# Patient Record
Sex: Female | Born: 1980 | Race: White | Hispanic: No | Marital: Single | Smoking: Current every day smoker
Health system: Southern US, Community
[De-identification: ages and names within clinical notes are randomized; demographics above are authoritative.]

## PROBLEM LIST (undated history)

## (undated) ENCOUNTER — Inpatient Hospital Stay (HOSPITAL_COMMUNITY): Payer: Self-pay

## (undated) DIAGNOSIS — I10 Essential (primary) hypertension: Secondary | ICD-10-CM

## (undated) DIAGNOSIS — M51369 Other intervertebral disc degeneration, lumbar region without mention of lumbar back pain or lower extremity pain: Secondary | ICD-10-CM

## (undated) DIAGNOSIS — M5136 Other intervertebral disc degeneration, lumbar region: Secondary | ICD-10-CM

## (undated) DIAGNOSIS — N3010 Interstitial cystitis (chronic) without hematuria: Secondary | ICD-10-CM

## (undated) DIAGNOSIS — F419 Anxiety disorder, unspecified: Secondary | ICD-10-CM

## (undated) DIAGNOSIS — IMO0002 Reserved for concepts with insufficient information to code with codable children: Secondary | ICD-10-CM

## (undated) DIAGNOSIS — J45909 Unspecified asthma, uncomplicated: Secondary | ICD-10-CM

## (undated) DIAGNOSIS — R87629 Unspecified abnormal cytological findings in specimens from vagina: Secondary | ICD-10-CM

## (undated) DIAGNOSIS — G4700 Insomnia, unspecified: Secondary | ICD-10-CM

## (undated) DIAGNOSIS — R87619 Unspecified abnormal cytological findings in specimens from cervix uteri: Secondary | ICD-10-CM

## (undated) DIAGNOSIS — F329 Major depressive disorder, single episode, unspecified: Secondary | ICD-10-CM

## (undated) DIAGNOSIS — F909 Attention-deficit hyperactivity disorder, unspecified type: Secondary | ICD-10-CM

## (undated) DIAGNOSIS — N3281 Overactive bladder: Secondary | ICD-10-CM

## (undated) DIAGNOSIS — F32A Depression, unspecified: Secondary | ICD-10-CM

## (undated) HISTORY — DX: Other intervertebral disc degeneration, lumbar region: M51.36

## (undated) HISTORY — PX: WISDOM TOOTH EXTRACTION: SHX21

## (undated) HISTORY — DX: Other intervertebral disc degeneration, lumbar region without mention of lumbar back pain or lower extremity pain: M51.369

## (undated) HISTORY — DX: Interstitial cystitis (chronic) without hematuria: N30.10

## (undated) HISTORY — DX: Unspecified abnormal cytological findings in specimens from vagina: R87.629

---

## 2011-10-25 HISTORY — PX: COLPOSCOPY: SHX161

## 2012-08-17 ENCOUNTER — Inpatient Hospital Stay (HOSPITAL_COMMUNITY)
Admission: AD | Admit: 2012-08-17 | Discharge: 2012-08-18 | Disposition: A | Discharge status: Home / Self Care | Payer: Non-veteran care | Source: Ambulatory Visit | Attending: Obstetrics & Gynecology | Admitting: Obstetrics & Gynecology

## 2012-08-17 ENCOUNTER — Encounter (HOSPITAL_COMMUNITY): Payer: Self-pay

## 2012-08-17 DIAGNOSIS — R109 Unspecified abdominal pain: Secondary | ICD-10-CM | POA: Insufficient documentation

## 2012-08-17 DIAGNOSIS — O26899 Other specified pregnancy related conditions, unspecified trimester: Secondary | ICD-10-CM

## 2012-08-17 DIAGNOSIS — O99891 Other specified diseases and conditions complicating pregnancy: Secondary | ICD-10-CM | POA: Insufficient documentation

## 2012-08-17 HISTORY — DX: Overactive bladder: N32.81

## 2012-08-17 HISTORY — DX: Reserved for concepts with insufficient information to code with codable children: IMO0002

## 2012-08-17 HISTORY — DX: Unspecified asthma, uncomplicated: J45.909

## 2012-08-17 HISTORY — DX: Depression, unspecified: F32.A

## 2012-08-17 HISTORY — DX: Major depressive disorder, single episode, unspecified: F32.9

## 2012-08-17 HISTORY — DX: Insomnia, unspecified: G47.00

## 2012-08-17 HISTORY — DX: Attention-deficit hyperactivity disorder, unspecified type: F90.9

## 2012-08-17 HISTORY — DX: Anxiety disorder, unspecified: F41.9

## 2012-08-17 HISTORY — DX: Unspecified abnormal cytological findings in specimens from cervix uteri: R87.619

## 2012-08-17 HISTORY — DX: Essential (primary) hypertension: I10

## 2012-08-17 LAB — URINALYSIS, ROUTINE W REFLEX MICROSCOPIC
Glucose, UA: NEGATIVE mg/dL
Hgb urine dipstick: NEGATIVE
Leukocytes, UA: NEGATIVE
Specific Gravity, Urine: 1.025 (ref 1.005–1.030)
pH: 6 (ref 5.0–8.0)

## 2012-08-17 LAB — CBC
HCT: 39.9 % (ref 36.0–46.0)
Hemoglobin: 13.4 g/dL (ref 12.0–15.0)
MCHC: 33.6 g/dL (ref 30.0–36.0)
RDW: 13.4 % (ref 11.5–15.5)
WBC: 10.6 10*3/uL — ABNORMAL HIGH (ref 4.0–10.5)

## 2012-08-17 LAB — POCT PREGNANCY, URINE
Preg Test, Ur: POSITIVE
Preg Test, Ur: POSITIVE — AB

## 2012-08-17 NOTE — MAU Provider Note (Signed)
Chief Complaint: Abdominal Pain   First Provider Initiated Contact with Patient 08/18/12 0244     SUBJECTIVE HPI: Pamela Duncan is a 31 y.o. G2P0010 at [redacted]w[redacted]d by LMP who presents with constant, dull low abd pain x 3 days, 6/10 on pain scale. Initially R>L, now L>R radiating to L rib. No bleeding, vaginal discharge, urinary complaints, GI complaints. Has taken Tylenol, tried position changes and used warm compresses w/ no relief.   Past Medical History  Diagnosis Date  . Anxiety   . Depression   . ADHD (attention deficit hyperactivity disorder)   . Insomnia   . Asthma   . Hypertension     stopped taking medicine  . Overactive bladder   . Abnormal Pap smear     colpo       OB History    Grav Para Term Preterm Abortions TAB SAB Ect Mult Living   2 0 0 0 1 0 1 0 0 0      # Outc Date GA Lbr Len/2nd Wgt Sex Del Anes PTL Lv   1 SAB            2 CUR              Past Surgical History  Procedure Date  . Colposcopy 2013  . Wisdom tooth extraction    History   Social History  . Marital Status: Single    Spouse Name: N/A    Number of Children: N/A  . Years of Education: N/A   Occupational History  . Not on file.   Social History Main Topics  . Smoking status: Current Every Day Smoker    Types: Cigars  . Smokeless tobacco: Not on file  . Alcohol Use: No  . Drug Use: No  . Sexually Active: Yes   Other Topics Concern  . Not on file   Social History Narrative  . No narrative on file   No current facility-administered medications on file prior to encounter.   Current Outpatient Prescriptions on File Prior to Encounter  Medication Sig Dispense Refill  . methylphenidate (CONCERTA) 18 MG CR tablet Take 18 mg by mouth every morning.      Marland Kitchen PARoxetine (PAXIL) 40 MG tablet Take 40 mg by mouth every morning.      . zolpidem (AMBIEN) 10 MG tablet Take 10 mg by mouth at bedtime as needed.       Allergies  Allergen Reactions  . Codeine Hives  . Latex Rash    ROS:  Pertinent items in HPI  OBJECTIVE Blood pressure 129/70, pulse 92, temperature 98.3 F (36.8 C), temperature source Oral, resp. rate 22, height 5\' 5"  (1.651 m), weight 96.798 kg (213 lb 6.4 oz), last menstrual period 07/13/2012. GENERAL: Well-developed, well-nourished female in mild distress. None when CNM out of room per RN. HEENT: Normocephalic HEART: normal rate RESP: normal effort ABDOMEN: Soft, non-tender EXTREMITIES: Nontender, no edema NEURO: Alert and oriented SPECULUM EXAM: NEFG, physiologic discharge, no blood noted, cervix friable BIMANUAL: cervix closed; uterus normal size, no, CNM or adnexal tenderness or masses  LAB RESULTS Results for orders placed during the hospital encounter of 08/17/12 (from the past 24 hour(s))  URINALYSIS, ROUTINE W REFLEX MICROSCOPIC     Status: Normal   Collection Time   08/17/12  9:19 PM      Component Value Range   Color, Urine YELLOW  YELLOW   APPearance CLEAR  CLEAR   Specific Gravity, Urine 1.025  1.005 - 1.030   pH  6.0  5.0 - 8.0   Glucose, UA NEGATIVE  NEGATIVE mg/dL   Hgb urine dipstick NEGATIVE  NEGATIVE   Bilirubin Urine NEGATIVE  NEGATIVE   Ketones, ur NEGATIVE  NEGATIVE mg/dL   Protein, ur NEGATIVE  NEGATIVE mg/dL   Urobilinogen, UA 0.2  0.0 - 1.0 mg/dL   Nitrite NEGATIVE  NEGATIVE   Leukocytes, UA NEGATIVE  NEGATIVE  POCT PREGNANCY, URINE     Status: Abnormal   Collection Time   08/17/12  9:29 PM      Component Value Range   Preg Test, Ur POSITIVE (*) NEGATIVE  HCG, QUANTITATIVE, PREGNANCY     Status: Abnormal   Collection Time   08/17/12 11:39 PM      Component Value Range   hCG, Beta Chain, Quant, S 1481 (*) <5 mIU/mL  CBC     Status: Abnormal   Collection Time   08/17/12 11:41 PM      Component Value Range   WBC 10.6 (*) 4.0 - 10.5 K/uL   RBC 4.40  3.87 - 5.11 MIL/uL   Hemoglobin 13.4  12.0 - 15.0 g/dL   HCT 16.1  09.6 - 04.5 %   MCV 90.7  78.0 - 100.0 fL   MCH 30.5  26.0 - 34.0 pg   MCHC 33.6  30.0 - 36.0  g/dL   RDW 40.9  81.1 - 91.4 %   Platelets 235  150 - 400 K/uL  ABO/RH     Status: Normal (Preliminary result)   Collection Time   08/17/12 11:42 PM      Component Value Range   ABO/RH(D) O NEG      IMAGING US Ob Comp Less 14 Wks  08/18/2012  *RADIOLOGY REPORT*  Clinical Data: Left lower quadrant pain, pregnant, beta HCG pending  OBSTETRIC <14 WK [place] AND TRANSVAGINAL OB US  Technique:  Both transabdominal and transvaginal ultrasound examinations were performed for complete evaluation of the gestation as well as the maternal uterus, adnexal regions, and pelvic cul-de-sac.  Transvaginal technique was performed to assess early pregnancy.  Comparison:  None.  Intrauterine gestational sac:  Visualized/normal in shape. Yolk sac: Not visualized. Embryo: Not visualized.  MSD: 3.9 mm  4 w 6 d [place] EDC: 04/21/2013  Maternal uterus/adnexae: No subchorionic hemorrhage.  Left ovary is within normal limits, measuring 1.6 x 2.2 x 1.5 cm.  Right ovary is within normal limits, measuring 2.6 x 3.1 by 1.7 cm, and is notable for a 1.3 x 1.4 x 1.3 cm corpus luteal cyst.  No free fluid.  IMPRESSION: Single intrauterine gestation with estimated gestational age [redacted] weeks 6 days by mean sac diameter.  No yolk sac or fetal pole is seen.  Serial beta HCG with a follow-up ultrasound in 14 days is suggested.   Original Report Authenticated By: Charline Bills, M.D.    US Ob Transvaginal  08/18/2012  *RADIOLOGY REPORT*  Clinical Data: Left lower quadrant pain, pregnant, beta HCG pending  OBSTETRIC <14 WK [place] AND TRANSVAGINAL OB US  Technique:  Both transabdominal and transvaginal ultrasound examinations were performed for complete evaluation of the gestation as well as the maternal uterus, adnexal regions, and pelvic cul-de-sac.  Transvaginal technique was performed to assess early pregnancy.  Comparison:  None.  Intrauterine gestational sac:  Visualized/normal in shape. Yolk sac: Not visualized. Embryo: Not visualized.  MSD: 3.9 mm   4 w 6 d [place] EDC: 04/21/2013  Maternal uterus/adnexae: No subchorionic hemorrhage.  Left ovary is within normal limits, measuring 1.6  x 2.2 x 1.5 cm.  Right ovary is within normal limits, measuring 2.6 x 3.1 by 1.7 cm, and is notable for a 1.3 x 1.4 x 1.3 cm corpus luteal cyst.  No free fluid.  IMPRESSION: Single intrauterine gestation with estimated gestational age [redacted] weeks 6 days by mean sac diameter.  No yolk sac or fetal pole is seen.  Serial beta HCG with a follow-up ultrasound in 14 days is suggested.   Original Report Authenticated By: Charline Bills, M.D.     MAU COURSE Requesting IM pain meds. Will give 1 Vicodin and Vistaril instead.   ASSESSMENT 1. Abdominal pain complicating pregnancy, antepartum    PLAN Discharge home SAB and ectopic precautions Follow-up Information    Follow up with [facility] [place] MATERNITY ADMISSIONS. On 08/20/2012. (for blood work)    Solicitor information:   [address] [id number] Wilhemina Bonito [place] [address] [phone]      Follow up with [facility] [place] MATERNITY ADMISSIONS. In 10 days. (ultrasound)    Contact information:   [address] [id number] mc [place] [address] [phone]          Medication List     As of 08/18/2012  2:48 AM    TAKE these medications         acetaminophen 500 MG tablet   Commonly known as: TYLENOL   Take 1,000 mg by mouth every 6 (six) hours as needed.      HYDROcodone-acetaminophen 5-500 MG per tablet   Commonly known as: VICODIN   Take 1 tablet by mouth every 6 (six) hours as needed for pain.      methylphenidate 18 MG CR tablet   Commonly known as: CONCERTA   Take 18 mg by mouth every morning.      PARoxetine 40 MG tablet   Commonly known as: PAXIL   Take 40 mg by mouth every morning.      prenatal multivitamin Tabs   Take 1 tablet by mouth daily.      zolpidem 10 MG tablet   Commonly known as: AMBIEN   Take 10 mg  by mouth at bedtime as needed.          [place], CNM 08/18/2012  1:06 AM

## 2012-08-17 NOTE — MAU Note (Signed)
For past 3 days have dull aching in stomach that is getting worse. More on L and goes from rib to pelvis. No bleeding

## 2012-08-18 ENCOUNTER — Encounter (HOSPITAL_COMMUNITY): Payer: Self-pay

## 2012-08-18 ENCOUNTER — Inpatient Hospital Stay (HOSPITAL_COMMUNITY): Payer: Non-veteran care

## 2012-08-18 DIAGNOSIS — R109 Unspecified abdominal pain: Secondary | ICD-10-CM

## 2012-08-18 DIAGNOSIS — O9989 Other specified diseases and conditions complicating pregnancy, childbirth and the puerperium: Secondary | ICD-10-CM

## 2012-08-18 LAB — ABO/RH: ABO/RH(D): O NEG

## 2012-08-18 LAB — HCG, QUANTITATIVE, PREGNANCY: hCG, Beta Chain, Quant, S: 1481 m[IU]/mL — ABNORMAL HIGH (ref <5)

## 2012-08-18 LAB — WET PREP, GENITAL: Trich, Wet Prep: NONE SEEN

## 2012-08-18 LAB — GC/CHLAMYDIA PROBE AMP, GENITAL: Chlamydia, DNA Probe: NEGATIVE

## 2012-08-18 MED ORDER — HYDROCODONE-ACETAMINOPHEN 5-500 MG PO TABS: 1.0000 | ORAL_TABLET | Freq: Four times a day (QID) | ORAL | Status: DC | PRN

## 2012-08-18 MED ORDER — HYDROXYZINE HCL 50 MG PO TABS
50.0000 mg | ORAL_TABLET | Freq: Once | ORAL | Status: AC
  Administered 2012-08-18: 50 mg via ORAL
  Filled 2012-08-18: qty 1

## 2012-08-18 MED ORDER — HYDROCODONE-ACETAMINOPHEN 5-325 MG PO TABS
1.0000 | ORAL_TABLET | Freq: Once | ORAL | Status: AC
  Administered 2012-08-18: 1 via ORAL
  Filled 2012-08-18: qty 1

## 2012-08-20 ENCOUNTER — Inpatient Hospital Stay (HOSPITAL_COMMUNITY)
Admission: AD | Admit: 2012-08-20 | Discharge: 2012-08-20 | Disposition: A | Discharge status: Home / Self Care | Payer: Non-veteran care | Source: Ambulatory Visit | Attending: Obstetrics & Gynecology | Admitting: Obstetrics & Gynecology

## 2012-08-20 DIAGNOSIS — O99891 Other specified diseases and conditions complicating pregnancy: Secondary | ICD-10-CM | POA: Insufficient documentation

## 2012-08-20 DIAGNOSIS — R109 Unspecified abdominal pain: Secondary | ICD-10-CM | POA: Insufficient documentation

## 2012-08-20 LAB — HCG, QUANTITATIVE, PREGNANCY: hCG, Beta Chain, Quant, S: 3838 m[IU]/mL — ABNORMAL HIGH (ref <5)

## 2012-08-20 NOTE — MAU Provider Note (Signed)
History     CSN: [id number]  Arrival date and time: 08/20/12 0903   None     No chief complaint on file.  HPI Pamela Duncan is 31 y.o. G2P0010 [redacted]w[redacted]d weeks presenting for follow up BHCG.  She was seen 2 days ago with lower abdominal pain in early pregnancy.  On that date BHCG 1481,Blood type O NEg(patient was not bleeding on that visit, rhophylac not administered, Ultrasound showed [redacted]w[redacted]d IUGS with No YS, FP, free fluid.  Normal appearing ovaries.  She continues with intermittent pain that goes from right and left.  Taking tylenol for discomfort.   Denies vaginal bleeding.    Past Medical History  Diagnosis Date  . Anxiety   . Depression   . ADHD (attention deficit hyperactivity disorder)   . Insomnia   . Asthma   . Hypertension     stopped taking medicine  . Overactive bladder   . Abnormal Pap smear     colpo    Past Surgical History  Procedure Date  . Colposcopy 2013  . Wisdom tooth extraction     Family History  Problem Relation Age of Onset  . Other Neg Hx     History  Substance Use Topics  . Smoking status: Current Every Day Smoker    Types: Cigars  . Smokeless tobacco: Not on file  . Alcohol Use: No    Allergies:  Allergies  Allergen Reactions  . Codeine Hives  . Latex Rash    Prescriptions prior to admission  Medication Sig Dispense Refill  . acetaminophen (TYLENOL) 500 MG tablet Take 1,000 mg by mouth every 6 (six) hours as needed.      Marland Kitchen HYDROcodone-acetaminophen (VICODIN) 5-500 MG per tablet Take 1 tablet by mouth every 6 (six) hours as needed for pain.  10 tablet  0  . methylphenidate (CONCERTA) 18 MG CR tablet Take 18 mg by mouth every morning.      Marland Kitchen PARoxetine (PAXIL) 40 MG tablet Take 40 mg by mouth every morning.      . Prenatal Vit-Fe Fumarate-FA (PRENATAL MULTIVITAMIN) TABS Take 1 tablet by mouth daily.      Marland Kitchen zolpidem (AMBIEN) 10 MG tablet Take 10 mg by mouth at bedtime as needed.        Review of Systems  Gastrointestinal: Positive  for abdominal pain (cramping bilaterally).  Genitourinary:       Not indicated  NEg for vaginal bleeding   Physical Exam   Last menstrual period 07/13/2012.  Physical Exam  Constitutional: She is oriented to person, place, and time. She appears well-developed and well-nourished. No distress.  HENT:  Head: Normocephalic.  Neck: Normal range of motion.  Respiratory: Effort normal.  Genitourinary:       Not indicated  Neurological: She is alert and oriented to person, place, and time.  Skin: Skin is warm and dry.  Psychiatric: She has a normal mood and affect. Her behavior is normal.    Results for orders placed during the hospital encounter of 08/20/12 (from the past 24 hour(s))  HCG, QUANTITATIVE, PREGNANCY     Status: Abnormal   Collection Time   08/20/12  9:09 AM      Component Value Range   hCG, Beta Chain, Quant, S 3838 (*) <5 mIU/mL   MAU Course  Procedures  MDM Discussed results with the patient.  Instructed to return for worsening sxs.  Assessment and Plan  A:Follow up BHCG--values have doubled-reassuring    Abdominal pain in early pregnancy  [redacted]w[redacted]d    P:  Continue with plans for ultrasound on Nov 4     Return sooner for increased pain or vaginal bleeding Davy Faught,EVE M 08/20/2012, 9:13 AM

## 2012-08-20 NOTE — MAU Note (Signed)
Patient to MAU for repeat BHCG. Patient states she continues to have a dull pain on the left side that is worse with movement. No bleeding.

## 2012-08-24 NOTE — MAU Provider Note (Signed)
Medical Screening exam and patient care preformed by advanced practice provider.  Agree with the above management.  

## 2012-08-29 ENCOUNTER — Inpatient Hospital Stay (HOSPITAL_COMMUNITY)
Admission: AD | Admit: 2012-08-29 | Discharge: 2012-08-29 | Disposition: A | Discharge status: Home / Self Care | Payer: Non-veteran care | Source: Ambulatory Visit | Attending: Obstetrics & Gynecology | Admitting: Obstetrics & Gynecology

## 2012-08-29 ENCOUNTER — Ambulatory Visit (HOSPITAL_COMMUNITY)
Admission: RE | Admit: 2012-08-29 | Discharge: 2012-08-29 | Disposition: A | Discharge status: Home / Self Care | Payer: Non-veteran care | Source: Ambulatory Visit | Attending: Advanced Practice Midwife | Admitting: Advanced Practice Midwife

## 2012-08-29 DIAGNOSIS — O10019 Pre-existing essential hypertension complicating pregnancy, unspecified trimester: Secondary | ICD-10-CM | POA: Insufficient documentation

## 2012-08-29 DIAGNOSIS — R109 Unspecified abdominal pain: Secondary | ICD-10-CM

## 2012-08-29 DIAGNOSIS — O99891 Other specified diseases and conditions complicating pregnancy: Secondary | ICD-10-CM | POA: Insufficient documentation

## 2012-08-29 DIAGNOSIS — O99330 Smoking (tobacco) complicating pregnancy, unspecified trimester: Secondary | ICD-10-CM | POA: Insufficient documentation

## 2012-08-29 DIAGNOSIS — O3440 Maternal care for other abnormalities of cervix, unspecified trimester: Secondary | ICD-10-CM | POA: Insufficient documentation

## 2012-08-29 DIAGNOSIS — O3680X0 Pregnancy with inconclusive fetal viability, not applicable or unspecified: Secondary | ICD-10-CM | POA: Insufficient documentation

## 2012-08-29 DIAGNOSIS — Z3490 Encounter for supervision of normal pregnancy, unspecified, unspecified trimester: Secondary | ICD-10-CM

## 2012-08-29 NOTE — MAU Provider Note (Signed)
  History     CSN: [id number]  Arrival date and time: 08/29/12 1402   None     No chief complaint on file.  HPI This is a 31 y.o. female at [redacted]w[redacted]d who presents for followup ultrasound. Denies any pain or bleeding now.   OB History    Grav Para Term Preterm Abortions TAB SAB Ect Mult Living   2 0 0 0 1 0 1 0 0 0       Past Medical History  Diagnosis Date  . Anxiety   . Depression   . ADHD (attention deficit hyperactivity disorder)   . Insomnia   . Asthma   . Hypertension     stopped taking medicine  . Overactive bladder   . Abnormal Pap smear     colpo    Past Surgical History  Procedure Date  . Colposcopy 2013  . Wisdom tooth extraction     Family History  Problem Relation Age of Onset  . Other Neg Hx     History  Substance Use Topics  . Smoking status: Current Every Day Smoker    Types: Cigars  . Smokeless tobacco: Not on file  . Alcohol Use: No    Allergies:  Allergies  Allergen Reactions  . Codeine Hives  . Latex Rash    Prescriptions prior to admission  Medication Sig Dispense Refill  . acetaminophen (TYLENOL) 500 MG tablet Take 1,000 mg by mouth every 6 (six) hours as needed.      . methylphenidate (CONCERTA) 18 MG CR tablet Take 18 mg by mouth every morning.      Marland Kitchen PARoxetine (PAXIL) 40 MG tablet Take 40 mg by mouth every morning.      . Prenatal Vit-Fe Fumarate-FA (PRENATAL MULTIVITAMIN) TABS Take 1 tablet by mouth daily.      Marland Kitchen zolpidem (AMBIEN) 10 MG tablet Take 10 mg by mouth at bedtime as needed.      . [DISCONTINUED] HYDROcodone-acetaminophen (VICODIN) 5-500 MG per tablet Take 1 tablet by mouth every 6 (six) hours as needed for pain.  10 tablet  0    ROS See HPI  Physical Exam   Last menstrual period 07/13/2012.  Physical Exam  Constitutional: She is oriented to person, place, and time. She appears well-developed and well-nourished. No distress.  Cardiovascular: Normal rate.   Respiratory: Effort normal.  GI: Soft.    Musculoskeletal: Normal range of motion.  Neurological: She is alert and oriented to person, place, and time.  Skin: Skin is warm and dry.  Psychiatric: She has a normal mood and affect.     MAU Course  Procedures  Assessment and Plan  A:  SIUP at .6.5 weeks      Live IUP  P: Released to obtain prenatal care      Followup PRN  [facility] 08/29/2012, 3:19 PM

## 2013-04-30 ENCOUNTER — Encounter (HOSPITAL_COMMUNITY): Payer: Self-pay | Admitting: *Deleted

## 2013-04-30 ENCOUNTER — Telehealth (HOSPITAL_COMMUNITY): Payer: Self-pay | Admitting: *Deleted

## 2013-04-30 NOTE — Telephone Encounter (Signed)
Resolve episode 

## 2013-12-08 ENCOUNTER — Emergency Department (HOSPITAL_COMMUNITY): Payer: Non-veteran care

## 2013-12-08 ENCOUNTER — Encounter (HOSPITAL_COMMUNITY): Payer: Self-pay | Admitting: Emergency Medicine

## 2013-12-08 ENCOUNTER — Emergency Department (HOSPITAL_COMMUNITY)
Admission: EM | Admit: 2013-12-08 | Discharge: 2013-12-08 | Disposition: A | Discharge status: Home / Self Care | Payer: Non-veteran care | Attending: Emergency Medicine | Admitting: Emergency Medicine

## 2013-12-08 DIAGNOSIS — G4700 Insomnia, unspecified: Secondary | ICD-10-CM | POA: Insufficient documentation

## 2013-12-08 DIAGNOSIS — R0789 Other chest pain: Secondary | ICD-10-CM

## 2013-12-08 DIAGNOSIS — J45909 Unspecified asthma, uncomplicated: Secondary | ICD-10-CM | POA: Insufficient documentation

## 2013-12-08 DIAGNOSIS — Z91040 Latex allergy status: Secondary | ICD-10-CM | POA: Insufficient documentation

## 2013-12-08 DIAGNOSIS — F411 Generalized anxiety disorder: Secondary | ICD-10-CM | POA: Insufficient documentation

## 2013-12-08 DIAGNOSIS — Z87448 Personal history of other diseases of urinary system: Secondary | ICD-10-CM | POA: Insufficient documentation

## 2013-12-08 DIAGNOSIS — F3289 Other specified depressive episodes: Secondary | ICD-10-CM | POA: Insufficient documentation

## 2013-12-08 DIAGNOSIS — R071 Chest pain on breathing: Secondary | ICD-10-CM | POA: Insufficient documentation

## 2013-12-08 DIAGNOSIS — F329 Major depressive disorder, single episode, unspecified: Secondary | ICD-10-CM | POA: Insufficient documentation

## 2013-12-08 DIAGNOSIS — Z791 Long term (current) use of non-steroidal anti-inflammatories (NSAID): Secondary | ICD-10-CM | POA: Insufficient documentation

## 2013-12-08 DIAGNOSIS — I10 Essential (primary) hypertension: Secondary | ICD-10-CM | POA: Insufficient documentation

## 2013-12-08 DIAGNOSIS — Z79899 Other long term (current) drug therapy: Secondary | ICD-10-CM | POA: Insufficient documentation

## 2013-12-08 DIAGNOSIS — F17200 Nicotine dependence, unspecified, uncomplicated: Secondary | ICD-10-CM | POA: Insufficient documentation

## 2013-12-08 MED ORDER — BENZONATATE 100 MG PO CAPS: 100.0000 mg | ORAL_CAPSULE | Freq: Three times a day (TID) | ORAL | Status: DC

## 2013-12-08 MED ORDER — OXYCODONE-ACETAMINOPHEN 5-325 MG PO TABS: 1.0000 | ORAL_TABLET | Freq: Four times a day (QID) | ORAL | Status: DC | PRN

## 2013-12-08 MED ORDER — MELOXICAM 7.5 MG PO TABS: 15.0000 mg | ORAL_TABLET | Freq: Every day | ORAL | Status: DC

## 2013-12-08 MED ORDER — KETOROLAC TROMETHAMINE 60 MG/2ML IM SOLN
60.0000 mg | Freq: Once | INTRAMUSCULAR | Status: AC
  Administered 2013-12-08: 60 mg via INTRAMUSCULAR
  Filled 2013-12-08: qty 2

## 2013-12-08 NOTE — ED Provider Notes (Signed)
Medical screening examination/treatment/procedure(s) were performed by non-physician practitioner and as supervising physician I was immediately available for consultation/collaboration.  EKG Interpretation   None         Kamaury Cutbirth, MD 12/08/13 0618 

## 2013-12-08 NOTE — ED Provider Notes (Signed)
CSN: [id number]     Arrival date & time 12/08/13  0116 History   First MD Initiated Contact with Patient 12/08/13 0158     Chief Complaint  Patient presents with  . Rib pain     (Consider location/radiation/quality/duration/timing/severity/associated sxs/prior Treatment) HPI Comments: 33 year old female presents to the emergency department for left-sided rib pain. Patient states that she has had a cough over the last 3 weeks. She states that one hour ago she was lying in bed when she coughed really hard and heard a "pop". Patient states this pop was followed by a pain sensation in her left lateral chest. Pain has been constant since onset and intermittently radiates down to her left hip. Patient denies any alleviating factors of her symptoms. She took a Flexeril without relief. Movement and deep breathing aggravates her pain. Patient denies fever, syncope or near syncope, hemoptysis, shortness of breath, vomiting, bowel or bladder incontinence, saddle anesthesia, and numbness/tingling.  The history is provided by the patient. No language interpreter was used.    Past Medical History  Diagnosis Date  . Anxiety   . Depression   . ADHD (attention deficit hyperactivity disorder)   . Insomnia   . Asthma   . Hypertension     stopped taking medicine  . Overactive bladder   . Abnormal Pap smear     colpo   Past Surgical History  Procedure Laterality Date  . Colposcopy  2013  . Wisdom tooth extraction     Family History  Problem Relation Age of Onset  . Other Neg Hx    History  Substance Use Topics  . Smoking status: Current Every Day Smoker    Types: Cigars  . Smokeless tobacco: Not on file  . Alcohol Use: No   OB History   Grav Para Term Preterm Abortions TAB SAB Ect Mult Living   2 0 0 0 1 0 1 0 0 0      Review of Systems  Respiratory: Positive for cough.   Cardiovascular: Positive for chest pain.  All other systems reviewed and are negative.    Allergies  Codeine and  Latex  Home Medications   Current Outpatient Rx  Name  Route  Sig  Dispense  Refill  . clonazePAM (KLONOPIN) 1 MG tablet   Oral   Take 1 mg by mouth 2 (two) times daily as needed for anxiety.         . cyclobenzaprine (FLEXERIL) 10 MG tablet   Oral   Take 10 mg by mouth 3 (three) times daily as needed for muscle spasms.         Marland Kitchen ETODOLAC PO   Oral   Take by mouth 2 (two) times daily.         Marland Kitchen levonorgestrel (MIRENA) 20 MCG/24HR IUD   Intrauterine   1 each by Intrauterine route once.         . Multiple Vitamin (MULTIVITAMIN WITH MINERALS) TABS tablet   Oral   Take 1 tablet by mouth daily.         Marland Kitchen oxyCODONE-acetaminophen (PERCOCET/ROXICET) 5-325 MG per tablet   Oral   Take 1 tablet by mouth 2 (two) times daily.         Marland Kitchen PARoxetine (PAXIL) 40 MG tablet   Oral   Take 40 mg by mouth every morning.         . zolpidem (AMBIEN) 10 MG tablet   Oral   Take 10 mg by mouth at bedtime as needed.         Marland Kitchen  benzonatate (TESSALON) 100 MG capsule   Oral   Take 1 capsule (100 mg total) by mouth every 8 (eight) hours.   21 capsule   0   . meloxicam (MOBIC) 7.5 MG tablet   Oral   Take 2 tablets (15 mg total) by mouth daily.   30 tablet   0   . oxyCODONE-acetaminophen (PERCOCET/ROXICET) 5-325 MG per tablet   Oral   Take 1-2 tablets by mouth every 6 (six) hours as needed for severe pain.   7 tablet   0    BP 132/84  Pulse 110  Temp(Src) 98 F (36.7 C) (Oral)  Resp 16  SpO2 100%  Physical Exam  Nursing note and vitals reviewed. Constitutional: She is oriented to person, place, and time. She appears well-developed and well-nourished. No distress.  HENT:  Head: Normocephalic and atraumatic.  Eyes: Conjunctivae and EOM are normal. No scleral icterus.  Neck: Normal range of motion. Neck supple.  Cardiovascular: Normal rate, regular rhythm and normal heart sounds.   Pulmonary/Chest: Effort normal and breath sounds normal. No accessory muscle usage. No  respiratory distress. She has no wheezes. She has no rales. She exhibits tenderness and bony tenderness. She exhibits no crepitus, no deformity, no swelling and no retraction.    Abdominal: Soft. She exhibits no distension. There is no tenderness. There is no rebound.  Musculoskeletal: Normal range of motion.  Neurological: She is alert and oriented to person, place, and time.  Skin: Skin is warm and dry. No rash noted. She is not diaphoretic. No erythema. No pallor.  Psychiatric: She has a normal mood and affect. Her behavior is normal.    ED Course  Procedures (including critical care time) Labs Review Labs Reviewed - No data to display  Imaging Review Dg Ribs Unilateral W/chest Left  12/08/2013   CLINICAL DATA:  33 year old female with acute onset pain left ribs and shortness of Breath. Initial encounter.  EXAM: LEFT RIBS AND CHEST - 3+ VIEW  COMPARISON:  None.  FINDINGS: Mildly low lung volumes. Normal cardiac size and mediastinal contours. Visualized tracheal air column is within normal limits. No pneumothorax, pleural effusion or confluent pulmonary opacity.  Bone mineralization is within normal limits. Normal thoracic segmentation. No acute displaced rib fracture identified. No acute osseous abnormality identified.  IMPRESSION: No acute cardiopulmonary abnormality. No displaced left rib fracture identified.   Electronically Signed   By: Augusto GambleLee  Hall M.D.   On: 12/08/2013 02:44    EKG Interpretation   None       MDM   Final diagnoses:  Costochondral chest pain   Uncomplicated costochondral chest pain secondary to coughing. Patient well and nontoxic appearing, hemodynamically stable, and afebrile. Tenderness to palpation appreciated to the anterior left lateral chest. Lung sounds clear to auscultation bilaterally. No retractions or accessory muscle use. Chest expansion symmetrical. X-ray negative for pneumothorax and rib fracture. Patient's pain treated with Toradol. She is stable  and appropriate for discharge with instruction to followup with her primary care provider. Mobic and Tessalon prescribed for symptoms; ice advised. Return precautions discussed and patient agreeable to plan with no unaddressed concerns.   Filed Vitals:   12/08/13 0139  BP: 132/84  Pulse: 110  Temp: 98 F (36.7 C)  TempSrc: Oral  Resp: 16  SpO2: 100%       Antony MaduraKelly Brynleigh Sequeira, PA-C 12/08/13 [phone]

## 2013-12-08 NOTE — ED Notes (Signed)
Pt presents with c/o left sided rib pain. Pt says she was laying in bed and she coughed really heard and heard a "pop" and felt terrible pain in her left rib cage area. Pt was ambulatory to triage.

## 2013-12-08 NOTE — Discharge Instructions (Signed)
Your x-ray today is negative for rib fracture or collapsed lung. Recommend Mobic for inflammation. You may take Percocet as needed for severe pain, 1-2 tablets every 6 hours as needed. Recommend Tessalon as prescribed for cough. You may apply ice to the affected area to also help limit swelling and inflammation. Followup with your primary care provider. Return to emergency department if symptoms worsen.  Costochondritis Costochondritis, sometimes called Tietze syndrome, is a swelling and irritation (inflammation) of the tissue (cartilage) that connects your ribs with your breastbone (sternum). It causes pain in the chest and rib area. Costochondritis usually goes away on its own over time. It can take up to 6 weeks or longer to get better, especially if you are unable to limit your activities. CAUSES  Some cases of costochondritis have no known cause. Possible causes include:  Injury (trauma).  Exercise or activity such as lifting.  Severe coughing. SIGNS AND SYMPTOMS  Pain and tenderness in the chest and rib area.  Pain that gets worse when coughing or taking deep breaths.  Pain that gets worse with specific movements. DIAGNOSIS  Your health care provider will do a physical exam and ask about your symptoms. Chest X-rays or other tests may be done to rule out other problems. TREATMENT  Costochondritis usually goes away on its own over time. Your health care provider may prescribe medicine to help relieve pain. HOME CARE INSTRUCTIONS   Avoid exhausting physical activity. Try not to strain your ribs during normal activity. This would include any activities using chest, abdominal, and side muscles, especially if heavy weights are used.  Apply ice to the affected area for the first 2 days after the pain begins.  Put ice in a plastic bag.  Place a towel between your skin and the bag.  Leave the ice on for 20 minutes, 2 3 times a day.  Only take over-the-counter or prescription medicines  as directed by your health care provider. SEEK MEDICAL CARE IF:  You have redness or swelling at the rib joints. These are signs of infection.  Your pain does not go away despite rest or medicine. SEEK IMMEDIATE MEDICAL CARE IF:   Your pain increases or you are very uncomfortable.  You have shortness of breath or difficulty breathing.  You cough up blood.  You have worse chest pains, sweating, or vomiting.  You have a fever or persistent symptoms for more than 2 3 days.  You have a fever and your symptoms suddenly get worse. MAKE SURE YOU:   Understand these instructions.  Will watch your condition.  Will get help right away if you are not doing well or get worse. Document Released: 07/20/2005 Document Revised: 07/31/2013 Document Reviewed: 05/14/2013 [facility] Patient Information 2014 [place], [place].

## 2014-01-20 IMAGING — US US OB COMP LESS 14 WK
1 series · 14 of 28 positions shown · non-contrast
Comparison: None.

CLINICAL DATA: Left lower quadrant pain, pregnant, beta HCG pending

OBSTETRIC <14 WK US AND TRANSVAGINAL OB US
TECHNIQUE: Both transabdominal and transvaginal ultrasound
examinations were performed for complete evaluation of the
gestation as well as the maternal uterus, adnexal regions, and
pelvic cul-de-sac.  Transvaginal technique was performed to assess
early pregnancy.

[Series 1: us ob comp less 14 wks · 14 of 47 slices shown]
[im 2/47]
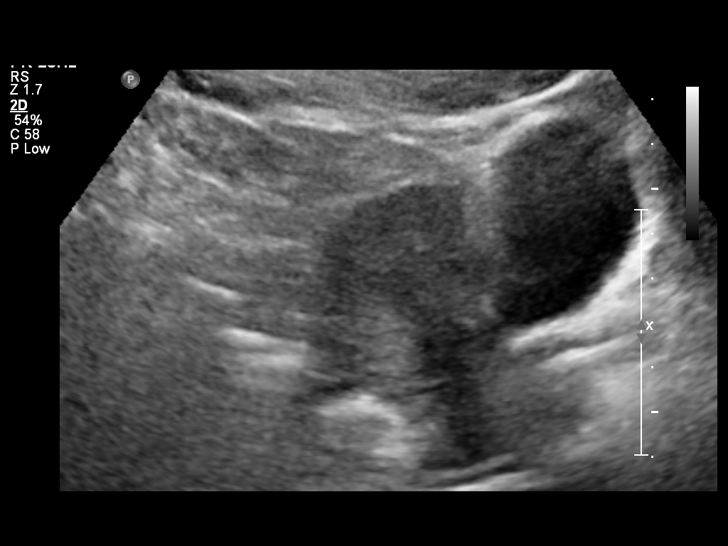
[im 6/47]
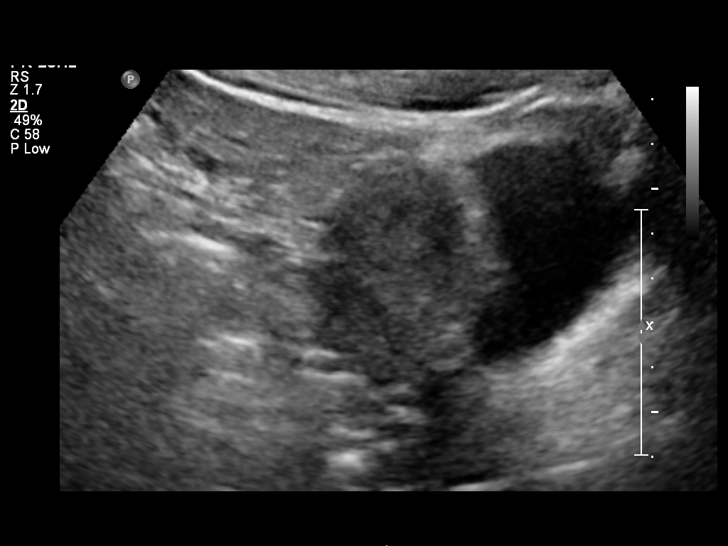
[im 9/47]
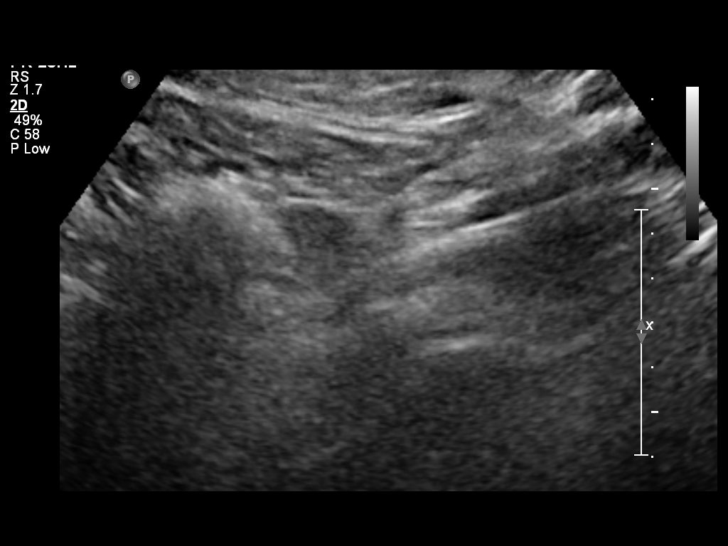
[im 12/47]
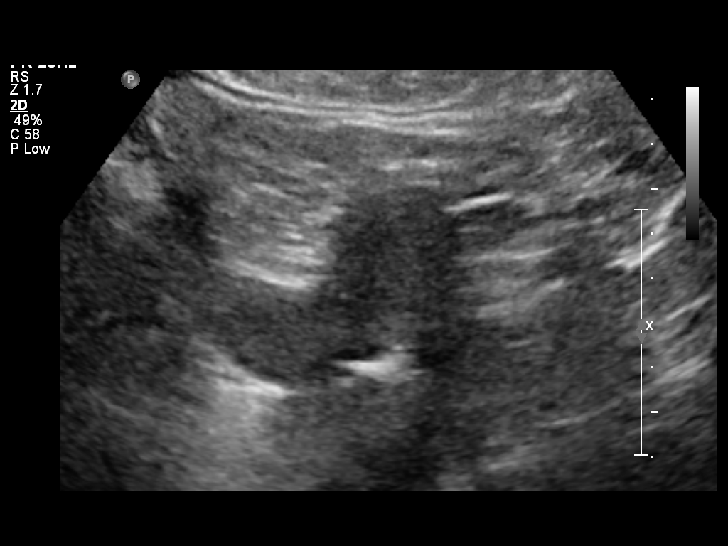
[im 16/47]
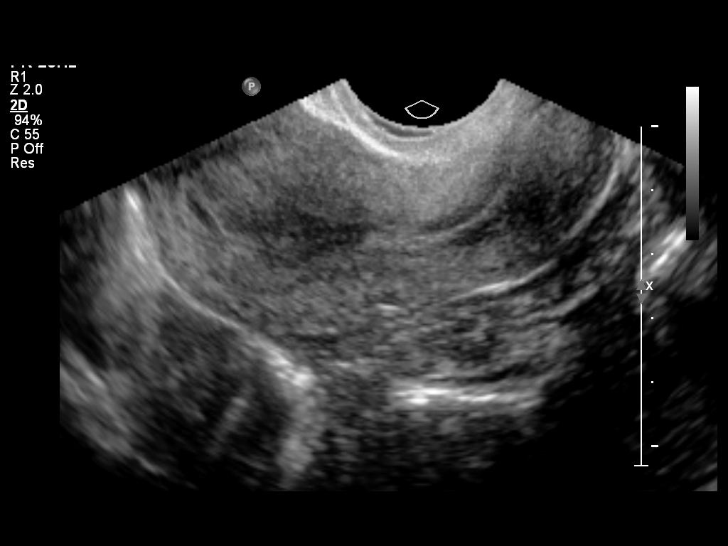
[im 19/47]
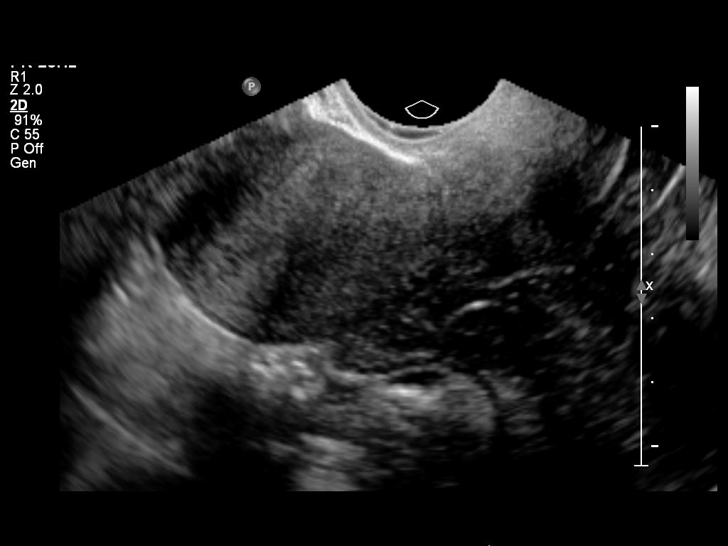
[im 23/47]
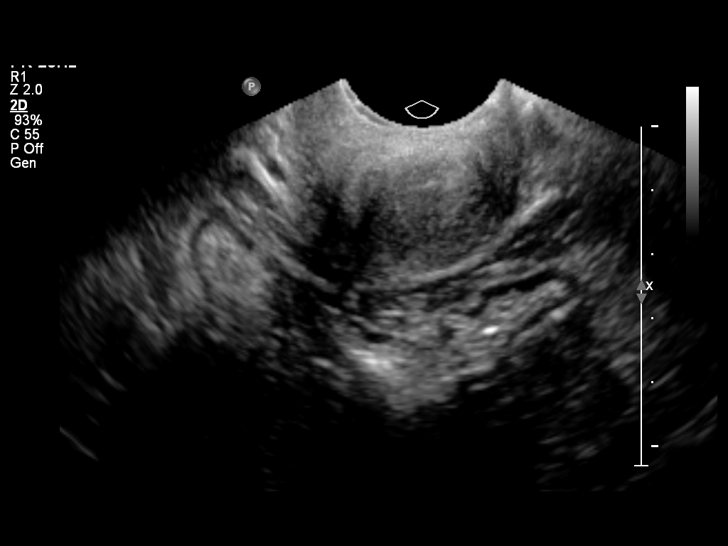
[im 26/47]
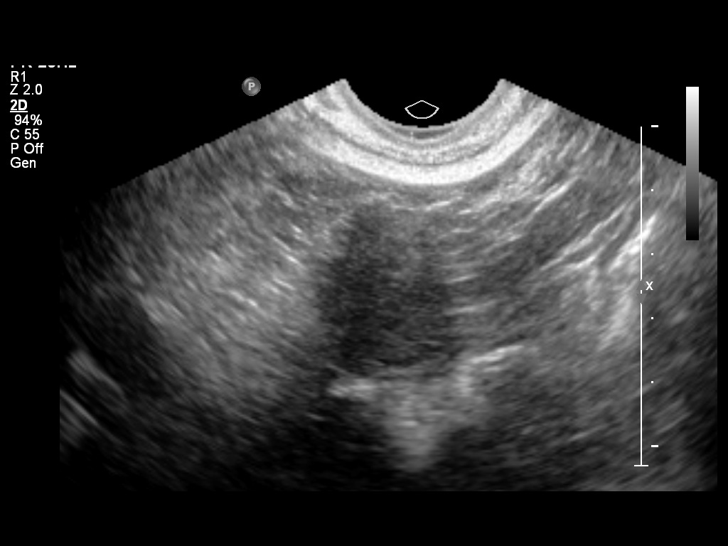
[im 29/47]
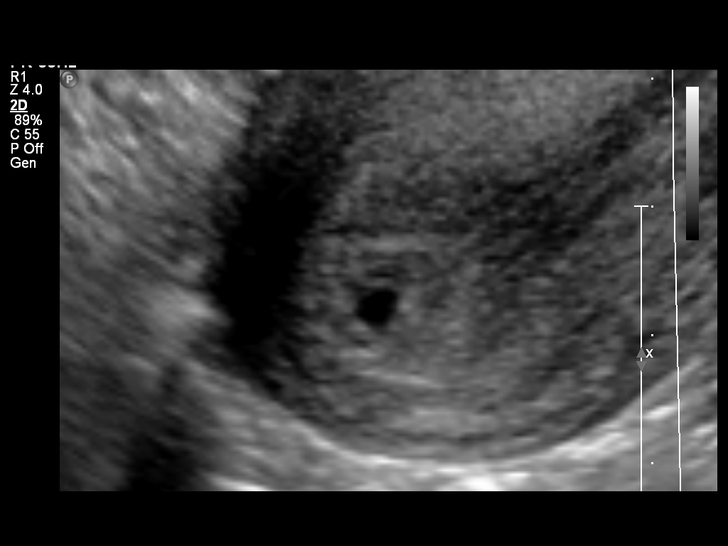
[im 33/47]
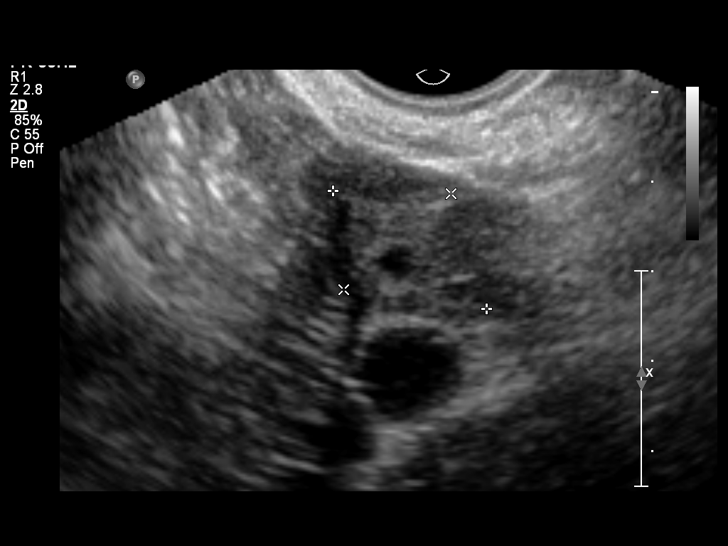
[im 36/47]
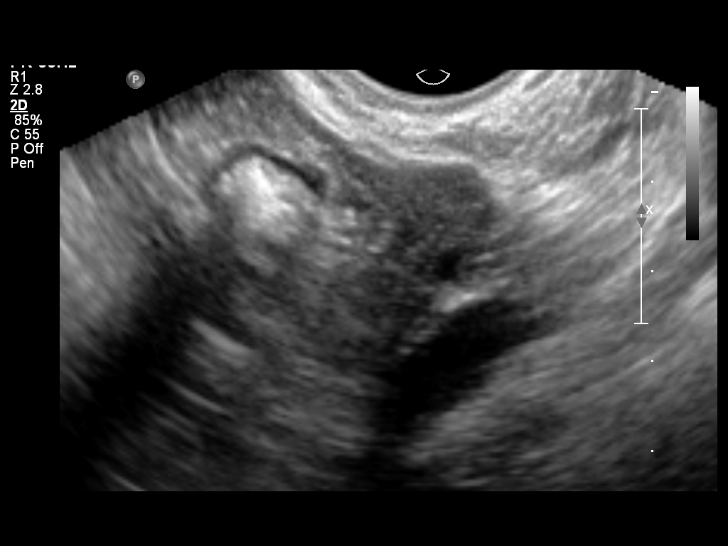
[im 40/47]
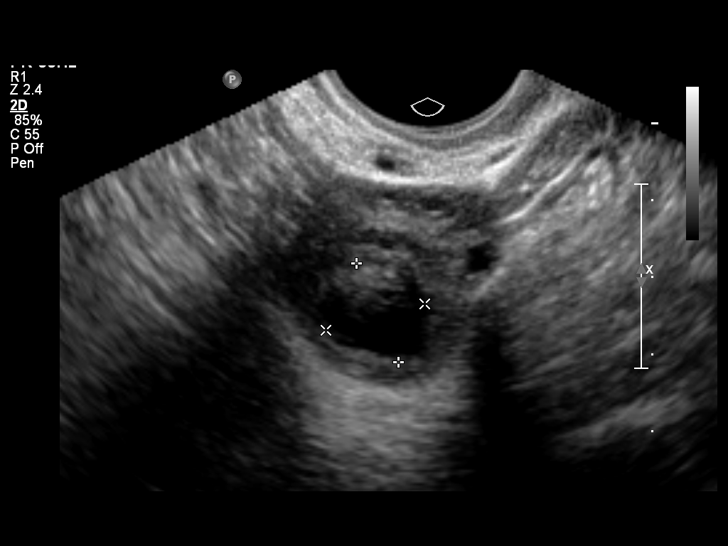
[im 43/47]
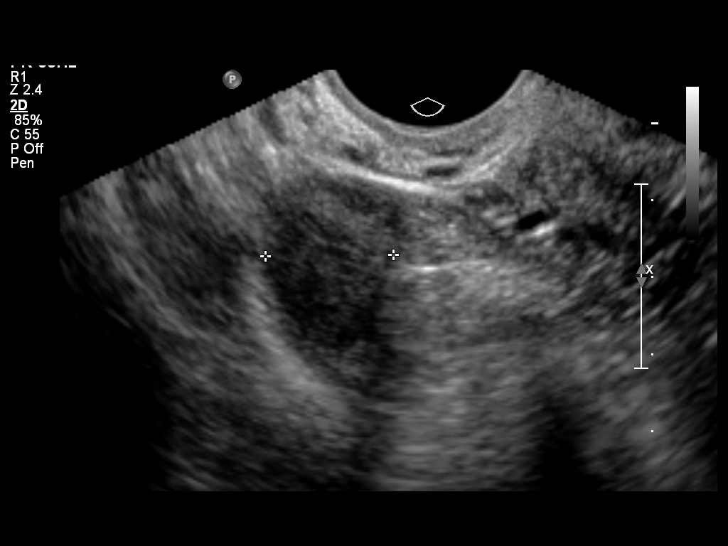
[im 47/47]
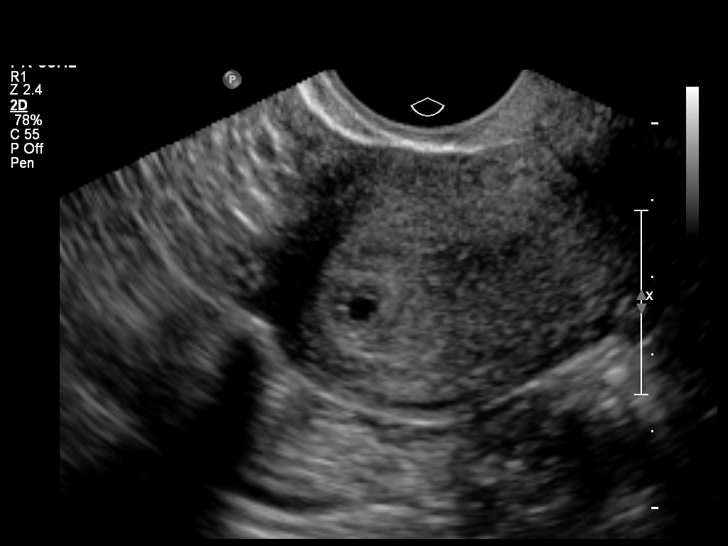

[14 of 28 positions shown; findings below may reference images not displayed]

Intrauterine gestational sac:  Visualized/normal in shape.
Yolk sac: Not visualized.
Embryo: Not visualized.

MSD: 3.9 mm  4 w 6 d
US EDC: 04/21/2013

Maternal uterus/adnexae:
No subchorionic hemorrhage.

Left ovary is within normal limits, measuring 1.6 x 2.2 x 1.5 cm.

Right ovary is within normal limits, measuring 2.6 x 3.1 by 1.7 cm,
and is notable for a 1.3 x 1.4 x 1.3 cm corpus luteal cyst.

No free fluid.
IMPRESSION: Single intrauterine gestation with estimated gestational age 4
weeks 6 days by mean sac diameter.  No yolk sac or fetal pole is
seen.

Serial beta HCG with a follow-up ultrasound in 14 days is
suggested.

## 2014-08-25 ENCOUNTER — Encounter (HOSPITAL_COMMUNITY): Payer: Self-pay | Admitting: Emergency Medicine

## 2016-03-13 ENCOUNTER — Encounter (HOSPITAL_BASED_OUTPATIENT_CLINIC_OR_DEPARTMENT_OTHER): Payer: Self-pay | Admitting: Emergency Medicine

## 2016-03-13 ENCOUNTER — Emergency Department (HOSPITAL_BASED_OUTPATIENT_CLINIC_OR_DEPARTMENT_OTHER)
Admission: EM | Admit: 2016-03-13 | Discharge: 2016-03-13 | Disposition: A | Discharge status: Home / Self Care | Payer: Non-veteran care | Attending: Emergency Medicine | Admitting: Emergency Medicine

## 2016-03-13 DIAGNOSIS — Z79899 Other long term (current) drug therapy: Secondary | ICD-10-CM | POA: Insufficient documentation

## 2016-03-13 DIAGNOSIS — F17210 Nicotine dependence, cigarettes, uncomplicated: Secondary | ICD-10-CM | POA: Diagnosis not present

## 2016-03-13 DIAGNOSIS — J45909 Unspecified asthma, uncomplicated: Secondary | ICD-10-CM | POA: Insufficient documentation

## 2016-03-13 DIAGNOSIS — F410 Panic disorder [episodic paroxysmal anxiety] without agoraphobia: Secondary | ICD-10-CM | POA: Diagnosis not present

## 2016-03-13 DIAGNOSIS — F418 Other specified anxiety disorders: Secondary | ICD-10-CM | POA: Diagnosis present

## 2016-03-13 DIAGNOSIS — I10 Essential (primary) hypertension: Secondary | ICD-10-CM | POA: Insufficient documentation

## 2016-03-13 MED ORDER — LORAZEPAM 1 MG PO TABS: 1.0000 mg | ORAL_TABLET | Freq: Three times a day (TID) | ORAL | Status: DC | PRN

## 2016-03-13 MED ORDER — LORAZEPAM 1 MG PO TABS
1.0000 mg | ORAL_TABLET | Freq: Once | ORAL | Status: AC
  Administered 2016-03-13: 1 mg via ORAL
  Filled 2016-03-13: qty 1

## 2016-03-13 NOTE — ED Notes (Signed)
Pt "ffels better than on arrival, calmer, NAD, reports "had a panic attack", sx resolved mostly, c/o earlier, sob, CP, dizziness, tingling in hands and fingers. Admits to hyperventilating.

## 2016-03-13 NOTE — ED Provider Notes (Signed)
CSN: [id number]     Arrival date & time 03/13/16  0319 History   None    Chief Complaint  Patient presents with  . Anxiety     (Consider location/radiation/quality/duration/timing/severity/associated sxs/prior Treatment) HPI  This is a 35 year old female with a history of anxiety and depression. She has been prescribed Remeron and circumflex well but has not been taking these issues not like the way they make her feel. She admits to smoking marijuana this morning about 2 AM. About 30 minutes later she had the onset of chest tightness, rapid heart rate, hyperventilation, lightheadedness, tingling in the legs and cramping in the hands and feet. She has had panic attacks in the past but this is more severe. Symptoms have been improving but are not completely abated. On arrival she was noted to be tachycardic but this has improved. She has not taken anything for her symptoms.  Past Medical History  Diagnosis Date  . Anxiety   . Depression   . ADHD (attention deficit hyperactivity disorder)   . Insomnia   . Asthma   . Hypertension     stopped taking medicine  . Overactive bladder   . Abnormal Pap smear     colpo   Past Surgical History  Procedure Laterality Date  . Colposcopy  2013  . Wisdom tooth extraction     Family History  Problem Relation Age of Onset  . Other Neg Hx    Social History  Substance Use Topics  . Smoking status: Current Every Day Smoker    Types: Cigars  . Smokeless tobacco: None  . Alcohol Use: No   OB History    Gravida Para Term Preterm AB TAB SAB Ectopic Multiple Living   2 0 0 0 1 0 1 0 0 0      Review of Systems  All other systems reviewed and are negative.   Allergies  Codeine and Latex  Home Medications   Prior to Admission medications   Medication Sig Start Date End Date Taking? Authorizing Provider  mirtazapine (REMERON) 30 MG tablet Take 30 mg by mouth at bedtime.   Yes Historical Provider, MD  QUEtiapine (SEROQUEL) 25 MG tablet Take  25 mg by mouth 3 (three) times daily.   Yes Historical Provider, MD  benzonatate (TESSALON) 100 MG capsule Take 1 capsule (100 mg total) by mouth every 8 (eight) hours. 12/08/13   Antony MaduraKelly Humes, PA-C  clonazePAM (KLONOPIN) 1 MG tablet Take 1 mg by mouth 2 (two) times daily as needed for anxiety.    Historical Provider, MD  cyclobenzaprine (FLEXERIL) 10 MG tablet Take 10 mg by mouth 3 (three) times daily as needed for muscle spasms.    Historical Provider, MD  ETODOLAC PO Take by mouth 2 (two) times daily.    Historical Provider, MD  levonorgestrel (MIRENA) 20 MCG/24HR IUD 1 each by Intrauterine route once.    Historical Provider, MD  meloxicam (MOBIC) 7.5 MG tablet Take 2 tablets (15 mg total) by mouth daily. 12/08/13   Antony MaduraKelly Humes, PA-C  Multiple Vitamin (MULTIVITAMIN WITH MINERALS) TABS tablet Take 1 tablet by mouth daily.    Historical Provider, MD  oxyCODONE-acetaminophen (PERCOCET/ROXICET) 5-325 MG per tablet Take 1 tablet by mouth 2 (two) times daily.    Historical Provider, MD  oxyCODONE-acetaminophen (PERCOCET/ROXICET) 5-325 MG per tablet Take 1-2 tablets by mouth every 6 (six) hours as needed for severe pain. 12/08/13   Antony MaduraKelly Humes, PA-C  PARoxetine (PAXIL) 40 MG tablet Take 40 mg by mouth every  morning.    Historical Provider, MD  zolpidem (AMBIEN) 10 MG tablet Take 10 mg by mouth at bedtime as needed.    Historical Provider, MD   BP 145/89 mmHg  Temp(Src) 98.7 F (37.1 C) (Oral)  Resp 18  Ht  (1.651 m)  Wt 189 lb (85.73 kg)  BMI 31.45 kg/m2  SpO2 99%   Physical Exam  General: Well-developed, well-nourished female in no acute distress; appearance consistent with age of record HENT: normocephalic; atraumatic Eyes: pupils equal, round and reactive to light; extraocular muscles intact Neck: supple Heart: regular rate and rhythm Lungs: clear to auscultation bilaterally Abdomen: soft; nondistended; nontender; bowel sounds present Extremities: No deformity; full range of motion;  pulses normal Neurologic: Awake, alert and oriented; motor function intact in all extremities and symmetric; no facial droop Skin: Warm and dry Psychiatric: Anxious    ED Course  Procedures (including critical care time)   MDM  EKG Interpretation:  Date & Time: 03/13/2016 3:31 AM  Rate: 108  Rhythm: sinus tachycardia  QRS Axis: normal  Intervals: normal  ST/T Wave abnormalities: normal  Conduction Disutrbances:none  Narrative Interpretation:   Old EKG Reviewed: none available        Paula Libra, MD 03/13/16 [phone]

## 2016-03-13 NOTE — Discharge Instructions (Signed)

## 2016-03-13 NOTE — ED Notes (Signed)
Patient reports that she has a history of anxiety but is not taking her medications as she should, instead she smokes pot to help with her anxiety. The patient reports that she started to feel lightheaded, and chest tightness, with legs tingling and SOB after smoking pot tonight

## 2016-03-13 NOTE — ED Notes (Signed)
Dr. Read DriversMolpus into room. Pt seen by EDP prior to RN assessment, see MD notes, pending orders.

## 2016-03-25 ENCOUNTER — Encounter (HOSPITAL_COMMUNITY): Payer: Self-pay | Admitting: Emergency Medicine

## 2016-03-25 ENCOUNTER — Emergency Department (HOSPITAL_COMMUNITY): Payer: Non-veteran care

## 2016-03-25 ENCOUNTER — Emergency Department (HOSPITAL_COMMUNITY)
Admission: EM | Admit: 2016-03-25 | Discharge: 2016-03-25 | Disposition: A | Discharge status: Home / Self Care | Payer: Non-veteran care | Attending: Emergency Medicine | Admitting: Emergency Medicine

## 2016-03-25 DIAGNOSIS — R0789 Other chest pain: Secondary | ICD-10-CM | POA: Diagnosis not present

## 2016-03-25 DIAGNOSIS — Z91040 Latex allergy status: Secondary | ICD-10-CM | POA: Insufficient documentation

## 2016-03-25 DIAGNOSIS — F17210 Nicotine dependence, cigarettes, uncomplicated: Secondary | ICD-10-CM | POA: Insufficient documentation

## 2016-03-25 DIAGNOSIS — F909 Attention-deficit hyperactivity disorder, unspecified type: Secondary | ICD-10-CM | POA: Insufficient documentation

## 2016-03-25 DIAGNOSIS — F329 Major depressive disorder, single episode, unspecified: Secondary | ICD-10-CM | POA: Diagnosis not present

## 2016-03-25 DIAGNOSIS — R079 Chest pain, unspecified: Secondary | ICD-10-CM | POA: Diagnosis present

## 2016-03-25 DIAGNOSIS — J45909 Unspecified asthma, uncomplicated: Secondary | ICD-10-CM | POA: Diagnosis not present

## 2016-03-25 DIAGNOSIS — I10 Essential (primary) hypertension: Secondary | ICD-10-CM | POA: Insufficient documentation

## 2016-03-25 LAB — BASIC METABOLIC PANEL
Anion gap: 10 (ref 5–15)
BUN: 13 mg/dL (ref 6–20)
CO2: 19 mmol/L — ABNORMAL LOW (ref 22–32)
Calcium: 9.1 mg/dL (ref 8.9–10.3)
Chloride: 110 mmol/L (ref 101–111)
Creatinine, Ser: 0.85 mg/dL (ref 0.44–1.00)
GFR calc Af Amer: >60 mL/min (ref >60)
GFR calc non Af Amer: >60 mL/min (ref >60)
Glucose, Bld: 101 mg/dL — ABNORMAL HIGH (ref 65–99)
POTASSIUM: 3.2 mmol/L — AB (ref 3.5–5.1)
SODIUM: 139 mmol/L (ref 135–145)

## 2016-03-25 LAB — I-STAT TROPONIN, ED: Troponin i, poc: 0 ng/mL (ref 0.00–0.08)

## 2016-03-25 LAB — CBC
HEMATOCRIT: 42.9 % (ref 36.0–46.0)
Hemoglobin: 15.1 g/dL — ABNORMAL HIGH (ref 12.0–15.0)
MCH: 30.9 pg (ref 26.0–34.0)
MCHC: 35.2 g/dL (ref 30.0–36.0)
MCV: 87.9 fL (ref 78.0–100.0)
Platelets: 195 10*3/uL (ref 150–400)
RBC: 4.88 MIL/uL (ref 3.87–5.11)
RDW: 12.8 % (ref 11.5–15.5)
WBC: 11.6 10*3/uL — AB (ref 4.0–10.5)

## 2016-03-25 MED ORDER — NAPROXEN 500 MG PO TABS: 500.0000 mg | ORAL_TABLET | Freq: Two times a day (BID) | ORAL | Status: DC

## 2016-03-25 NOTE — ED Notes (Signed)
Bed: WA08 Expected date:  Expected time:  Means of arrival:  Comments: 

## 2016-03-25 NOTE — ED Notes (Signed)
Patient reports left sided chest heaviness radiating to left arm starting PTA, worse with pressure. Denies SOB, N/V, diaphoresis or weakness.

## 2016-03-25 NOTE — Discharge Instructions (Signed)

## 2016-03-25 NOTE — ED Provider Notes (Signed)
CSN: [id number]     Arrival date & time 03/25/16  0114 History   First MD Initiated Contact with Patient 03/25/16 0151     No chief complaint on file.    (Consider location/radiation/quality/duration/timing/severity/associated sxs/prior Treatment) HPI Comments: Patient is a 35 year old female that presents to the emergency department for evaluation of left-sided chest pain. Patient describes her pain as a soreness and "heaviness". She states that symptoms have been intermittent and mildly worsening since onset 2 weeks ago. She reports initially feeling a "pop" in her chest. Symptoms are aggravated with palpation to the chest wall. She has had no diaphoresis, syncope, nausea, vomiting, or weakness. She states that deep breathing does mildly aggravated her symptoms as well. She is complaining of some subjective paresthesias in her left shoulder and upper extremity. She denies taking any medication for her symptoms. She was seen and evaluated after the initial "pop"; patient discharged from the ED with a diagnosis of anxiety.  Patient does have a history of hypertension. She thinks that she has borderline hyperlipidemia. Patient is a smoker. She states that her mother had a heart attack at age 34. Her father was a smoker and pest with lung cancer. [facility]  The history is provided by the patient. No language interpreter was used.    Past Medical History  Diagnosis Date  . Anxiety   . Depression   . ADHD (attention deficit hyperactivity disorder)   . Insomnia   . Asthma   . Hypertension     stopped taking medicine  . Overactive bladder   . Abnormal Pap smear     colpo   Past Surgical History  Procedure Laterality Date  . Colposcopy  2013  . Wisdom tooth extraction     Family History  Problem Relation Age of Onset  . Other Neg Hx    Social History  Substance Use Topics  . Smoking status: Current Every Day Smoker    Types: Cigars  .  Smokeless tobacco: None  . Alcohol Use: No   OB History    Gravida Para Term Preterm AB TAB SAB Ectopic Multiple Living   2 0 0 0 1 0 1 0 0 0       Review of Systems  Constitutional: Negative for fever.  Cardiovascular: Positive for chest pain.  Gastrointestinal: Negative for vomiting.  Neurological: Negative for syncope and weakness.       +subjective paresthesias LUE  All other systems reviewed and are negative.   Allergies  Codeine and Latex  Home Medications   Prior to Admission medications   Medication Sig Start Date End Date Taking? Authorizing Provider  busPIRone (BUSPAR) 7.5 MG tablet Take 7.5 mg by mouth 2 (two) times daily.   Yes Historical Provider, MD  HYDROcodone-acetaminophen (NORCO/VICODIN) 5-325 MG tablet Take 1 tablet by mouth every 6 (six) hours as needed for moderate pain or severe pain.   Yes Historical Provider, MD  levonorgestrel (MIRENA) 20 MCG/24HR IUD 1 each by Intrauterine route once.   Yes Historical Provider, MD  mirtazapine (REMERON) 30 MG tablet Take 30 mg by mouth at bedtime as needed (sleep).    Yes Historical Provider, MD  zolpidem (AMBIEN) 10 MG tablet Take 10 mg by mouth at bedtime as needed for sleep.    Yes Historical Provider, MD  LORazepam (ATIVAN) 1 MG tablet Take 1 tablet (1 mg total) by mouth 3 (three) times daily as needed for anxiety. Patient not taking: Reported  on 03/25/2016 03/13/16   Paula Libra, MD  naproxen (NAPROSYN) 500 MG tablet Take 1 tablet (500 mg total) by mouth 2 (two) times daily. 03/25/16   Antony Madura, PA-C   BP 143/86 mmHg  Pulse 85  Temp(Src) 98.3 F (36.8 C) (Oral)  Resp 16  SpO2 99%   Physical Exam  Constitutional: She is oriented to person, place, and time. She appears well-developed and well-nourished. No distress.  Nontoxic appearing  HENT:  Head: Normocephalic and atraumatic.  Eyes: Conjunctivae and EOM are normal. No scleral icterus.  Neck: Normal range of motion.  Cardiovascular: Normal rate, regular  rhythm and intact distal pulses.   Pulmonary/Chest: Effort normal. No respiratory distress. She has no wheezes. She exhibits tenderness.    Respirations even and unlabored  Musculoskeletal: Normal range of motion.  Neurological: She is alert and oriented to person, place, and time. She exhibits normal muscle tone. Coordination normal.  GCS 15. Patient moving all extremities.  Skin: Skin is warm and dry. No rash noted. She is not diaphoretic. No erythema. No pallor.  Psychiatric: She has a normal mood and affect. Her behavior is normal.  Nursing note and vitals reviewed.   ED Course  Procedures (including critical care time) Labs Review Labs Reviewed  BASIC METABOLIC PANEL - Abnormal; Notable for the following:    Potassium 3.2 (*)    CO2 19 (*)    Glucose, Bld 101 (*)    All other components within normal limits  CBC - Abnormal; Notable for the following:    WBC 11.6 (*)    Hemoglobin 15.1 (*)    All other components within normal limits  I-STAT TROPOININ, ED    Imaging Review Dg Chest 2 View  03/25/2016  CLINICAL DATA:  Chronic anterior chest pain radiating to the left arm. Initial encounter. EXAM: CHEST  2 VIEW COMPARISON:  Chest radiograph performed 12/08/2013 FINDINGS: The lungs are well-aerated and clear. There is no evidence of focal opacification, pleural effusion or pneumothorax. The heart is normal in size; the mediastinal contour is within normal limits. No acute osseous abnormalities are seen. IMPRESSION: No acute cardiopulmonary process seen. Electronically Signed   By: Roanna Raider M.D.   On: 03/25/2016 02:35     I have personally reviewed and evaluated these images and lab results as part of my medical decision-making.   EKG Interpretation   Date/Time:  Friday March 25 2016 01:24:52 EDT Ventricular Rate:  83 PR Interval:  146 QRS Duration: 84 QT Interval:  371 QTC Calculation: 436 R Axis:   50 Text Interpretation:  Sinus rhythm Baseline wander in lead(s) I  III aVL No  old tracing to compare Confirmed by CAMPOS  MD, Caryn Bee ([id number]) on 03/25/2016  1:39:15 AM      MDM   Final diagnoses:  Chest wall pain    35 year old female presents to the emergency department for evaluation of left-sided chest pain. Symptoms have been persistent since last evaluation on 03/13/2016. Cardiac workup is reassuring. Doubt cardiac etiology given chronicity of symptoms. Pain is also reproducible on palpation suggesting musculoskeletal etiology. Heart score is 2 for risk factors, consistent with low risk of acute coronary event. Doubt PE given lack of tachycardia, tachypnea, dyspnea, or hypoxia. Patient low risk for PE.  Plan to manage symptoms on an outpatient basis with NSAIDs. I have advised primary care follow-up for recheck. No indication for further emergent workup at this time. Patient discharged in satisfactory condition with no unaddressed concerns.   Filed Vitals:  03/25/16 0127 03/25/16 0329  BP: 143/86 132/79  Pulse: 85 70  Temp: 98.3 F (36.8 C) 98.3 F (36.8 C)  TempSrc: Oral Oral  Resp: 16 20  SpO2: 99% 99%     Antony Madura, PA-C 03/25/16 0530  Azalia Bilis, MD 03/25/16 [phone]

## 2016-03-28 ENCOUNTER — Encounter (HOSPITAL_COMMUNITY): Payer: Self-pay | Admitting: Emergency Medicine

## 2016-03-28 ENCOUNTER — Emergency Department (HOSPITAL_COMMUNITY)
Admission: EM | Admit: 2016-03-28 | Discharge: 2016-03-28 | Disposition: A | Discharge status: Home / Self Care | Payer: Non-veteran care | Attending: Emergency Medicine | Admitting: Emergency Medicine

## 2016-03-28 DIAGNOSIS — F329 Major depressive disorder, single episode, unspecified: Secondary | ICD-10-CM | POA: Diagnosis not present

## 2016-03-28 DIAGNOSIS — F17210 Nicotine dependence, cigarettes, uncomplicated: Secondary | ICD-10-CM | POA: Diagnosis not present

## 2016-03-28 DIAGNOSIS — F410 Panic disorder [episodic paroxysmal anxiety] without agoraphobia: Secondary | ICD-10-CM | POA: Diagnosis present

## 2016-03-28 DIAGNOSIS — F419 Anxiety disorder, unspecified: Secondary | ICD-10-CM | POA: Diagnosis not present

## 2016-03-28 DIAGNOSIS — I10 Essential (primary) hypertension: Secondary | ICD-10-CM | POA: Insufficient documentation

## 2016-03-28 DIAGNOSIS — J45909 Unspecified asthma, uncomplicated: Secondary | ICD-10-CM | POA: Insufficient documentation

## 2016-03-28 MED ORDER — LORAZEPAM 1 MG PO TABS
1.0000 mg | ORAL_TABLET | Freq: Once | ORAL | Status: AC
  Administered 2016-03-28: 1 mg via ORAL
  Filled 2016-03-28: qty 1

## 2016-03-28 MED ORDER — LORAZEPAM 0.5 MG PO TABS: 0.5000 mg | ORAL_TABLET | Freq: Once | ORAL | Status: DC | PRN

## 2016-03-28 NOTE — ED Notes (Signed)
MD/PA at bedside. 
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
## 2016-03-28 NOTE — Discharge Instructions (Signed)

## 2017-03-14 ENCOUNTER — Inpatient Hospital Stay (HOSPITAL_COMMUNITY)
Admission: AD | Admit: 2017-03-14 | Discharge: 2017-03-14 | Disposition: A | Discharge status: Home / Self Care | Payer: Medicare HMO | Source: Ambulatory Visit | Attending: Family Medicine | Admitting: Family Medicine

## 2017-03-14 ENCOUNTER — Inpatient Hospital Stay (HOSPITAL_COMMUNITY): Payer: Medicare HMO

## 2017-03-14 ENCOUNTER — Encounter (HOSPITAL_COMMUNITY): Payer: Self-pay | Admitting: *Deleted

## 2017-03-14 DIAGNOSIS — O9989 Other specified diseases and conditions complicating pregnancy, childbirth and the puerperium: Secondary | ICD-10-CM | POA: Diagnosis not present

## 2017-03-14 DIAGNOSIS — J45909 Unspecified asthma, uncomplicated: Secondary | ICD-10-CM

## 2017-03-14 DIAGNOSIS — O161 Unspecified maternal hypertension, first trimester: Secondary | ICD-10-CM | POA: Diagnosis not present

## 2017-03-14 DIAGNOSIS — O99519 Diseases of the respiratory system complicating pregnancy, unspecified trimester: Secondary | ICD-10-CM

## 2017-03-14 DIAGNOSIS — F17200 Nicotine dependence, unspecified, uncomplicated: Secondary | ICD-10-CM | POA: Insufficient documentation

## 2017-03-14 DIAGNOSIS — F419 Anxiety disorder, unspecified: Secondary | ICD-10-CM | POA: Insufficient documentation

## 2017-03-14 DIAGNOSIS — J40 Bronchitis, not specified as acute or chronic: Secondary | ICD-10-CM | POA: Diagnosis not present

## 2017-03-14 DIAGNOSIS — Z6791 Unspecified blood type, Rh negative: Secondary | ICD-10-CM

## 2017-03-14 DIAGNOSIS — Z3A01 Less than 8 weeks gestation of pregnancy: Secondary | ICD-10-CM | POA: Insufficient documentation

## 2017-03-14 DIAGNOSIS — F909 Attention-deficit hyperactivity disorder, unspecified type: Secondary | ICD-10-CM | POA: Insufficient documentation

## 2017-03-14 DIAGNOSIS — J4521 Mild intermittent asthma with (acute) exacerbation: Secondary | ICD-10-CM

## 2017-03-14 DIAGNOSIS — O209 Hemorrhage in early pregnancy, unspecified: Secondary | ICD-10-CM | POA: Diagnosis not present

## 2017-03-14 DIAGNOSIS — O99341 Other mental disorders complicating pregnancy, first trimester: Secondary | ICD-10-CM | POA: Diagnosis not present

## 2017-03-14 DIAGNOSIS — J069 Acute upper respiratory infection, unspecified: Secondary | ICD-10-CM | POA: Diagnosis not present

## 2017-03-14 DIAGNOSIS — O99511 Diseases of the respiratory system complicating pregnancy, first trimester: Secondary | ICD-10-CM | POA: Insufficient documentation

## 2017-03-14 DIAGNOSIS — O26891 Other specified pregnancy related conditions, first trimester: Secondary | ICD-10-CM | POA: Insufficient documentation

## 2017-03-14 DIAGNOSIS — F329 Major depressive disorder, single episode, unspecified: Secondary | ICD-10-CM | POA: Insufficient documentation

## 2017-03-14 DIAGNOSIS — R109 Unspecified abdominal pain: Secondary | ICD-10-CM | POA: Diagnosis not present

## 2017-03-14 DIAGNOSIS — O26899 Other specified pregnancy related conditions, unspecified trimester: Secondary | ICD-10-CM

## 2017-03-14 LAB — URINALYSIS, ROUTINE W REFLEX MICROSCOPIC
Bilirubin Urine: NEGATIVE
GLUCOSE, UA: NEGATIVE mg/dL
KETONES UR: NEGATIVE mg/dL
Nitrite: NEGATIVE
Protein, ur: 30 mg/dL — AB
Specific Gravity, Urine: 1.013 (ref 1.005–1.030)
pH: 6 (ref 5.0–8.0)

## 2017-03-14 LAB — CBC
HCT: 42.9 % (ref 36.0–46.0)
HEMOGLOBIN: 15 g/dL (ref 12.0–15.0)
MCH: 30.4 pg (ref 26.0–34.0)
MCHC: 35 g/dL (ref 30.0–36.0)
MCV: 87 fL (ref 78.0–100.0)
Platelets: 216 10*3/uL (ref 150–400)
RBC: 4.93 MIL/uL (ref 3.87–5.11)
RDW: 12.6 % (ref 11.5–15.5)
WBC: 9.2 10*3/uL (ref 4.0–10.5)

## 2017-03-14 LAB — WET PREP, GENITAL
CLUE CELLS WET PREP: NONE SEEN
Sperm: NONE SEEN
TRICH WET PREP: NONE SEEN
Yeast Wet Prep HPF POC: NONE SEEN

## 2017-03-14 LAB — HCG, QUANTITATIVE, PREGNANCY: HCG, BETA CHAIN, QUANT, S: 29 m[IU]/mL — AB (ref <5)

## 2017-03-14 LAB — POCT PREGNANCY, URINE: PREG TEST UR: POSITIVE — AB

## 2017-03-14 MED ORDER — METHYLPREDNISOLONE SODIUM SUCC 125 MG IJ SOLR
125.0000 mg | Freq: Once | INTRAMUSCULAR | Status: AC
  Administered 2017-03-14: 125 mg via INTRAMUSCULAR
  Filled 2017-03-14: qty 2

## 2017-03-14 MED ORDER — RHO D IMMUNE GLOBULIN 1500 UNIT/2ML IJ SOSY
300.0000 ug | PREFILLED_SYRINGE | Freq: Once | INTRAMUSCULAR | Status: AC
  Administered 2017-03-14: 300 ug via INTRAMUSCULAR
  Filled 2017-03-14: qty 2

## 2017-03-14 MED ORDER — ALBUTEROL SULFATE (2.5 MG/3ML) 0.083% IN NEBU
2.5000 mg | INHALATION_SOLUTION | RESPIRATORY_TRACT | Status: DC | PRN
  Administered 2017-03-14: 2.5 mg via RESPIRATORY_TRACT
  Filled 2017-03-14 (×2): qty 3

## 2017-03-14 MED ORDER — ALBUTEROL SULFATE HFA 108 (90 BASE) MCG/ACT IN AERS: 2.0000 | INHALATION_SPRAY | Freq: Four times a day (QID) | RESPIRATORY_TRACT | 2 refills | Status: DC | PRN

## 2017-03-14 MED ORDER — METHYLPREDNISOLONE SODIUM SUCC 125 MG IJ SOLR: 125.0000 mg | Freq: Once | INTRAMUSCULAR | Status: DC

## 2017-03-14 NOTE — MAU Note (Signed)
Urine in lab 

## 2017-03-14 NOTE — Discharge Instructions (Signed)
Asthma, Adult Asthma is a recurring condition in which the airways tighten and narrow. Asthma can make it difficult to breathe. It can cause coughing, wheezing, and shortness of breath. Asthma episodes, also called asthma attacks, range from minor to life-threatening. Asthma cannot be cured, but medicines and lifestyle changes can help control it. What are the causes? Asthma is believed to be caused by inherited (genetic) and environmental factors, but its exact cause is unknown. Asthma may be triggered by allergens, lung infections, or irritants in the air. Asthma triggers are different for each person. Common triggers include:  Animal dander.  Dust mites.  Cockroaches.  Pollen from trees or grass.  Mold.  Smoke.  Air pollutants such as dust, household cleaners, hair sprays, aerosol sprays, paint fumes, strong chemicals, or strong odors.  Cold air, weather changes, and winds (which increase molds and pollens in the air).  Strong emotional expressions such as crying or laughing hard.  Stress.  Certain medicines (such as aspirin) or types of drugs (such as beta-blockers).  Sulfites in foods and drinks. Foods and drinks that may contain sulfites include dried fruit, potato chips, and sparkling grape juice.  Infections or inflammatory conditions such as the flu, a cold, or an inflammation of the nasal membranes (rhinitis).  Gastroesophageal reflux disease (GERD).  Exercise or strenuous activity. What are the signs or symptoms? Symptoms may occur immediately after asthma is triggered or many hours later. Symptoms include:  Wheezing.  Excessive nighttime or early morning coughing.  Frequent or severe coughing with a common cold.  Chest tightness.  Shortness of breath. How is this diagnosed? The diagnosis of asthma is made by a review of your medical history and a physical exam. Tests may also be performed. These may include:  Lung function studies. These tests show how  much air you breathe in and out.  Allergy tests.  Imaging tests such as X-rays. How is this treated? Asthma cannot be cured, but it can usually be controlled. Treatment involves identifying and avoiding your asthma triggers. It also involves medicines. There are 2 classes of medicine used for asthma treatment:  Controller medicines. These prevent asthma symptoms from occurring. They are usually taken every day.  Reliever or rescue medicines. These quickly relieve asthma symptoms. They are used as needed and provide short-term relief. Your health care provider will help you create an asthma action plan. An asthma action plan is a written plan for managing and treating your asthma attacks. It includes a list of your asthma triggers and how they may be avoided. It also includes information on when medicines should be taken and when their dosage should be changed. An action plan may also involve the use of a device called a peak flow meter. A peak flow meter measures how well the lungs are working. It helps you monitor your condition. Follow these instructions at home:  Take medicines only as directed by your health care provider. Speak with your health care provider if you have questions about how or when to take the medicines.  Use a peak flow meter as directed by your health care provider. Record and keep track of readings.  Understand and use the action plan to help minimize or stop an asthma attack without needing to seek medical care.  Control your home environment in the following ways to help prevent asthma attacks:  Do not smoke. Avoid being exposed to secondhand smoke.  Change your heating and air conditioning filter regularly.  Limit your use of fireplaces  and wood stoves.  Get rid of pests (such as roaches and mice) and their droppings.  Throw away plants if you see mold on them.  Clean your floors and dust regularly. Use unscented cleaning products.  Try to have someone  else vacuum for you regularly. Stay out of rooms while they are being vacuumed and for a short while afterward. If you vacuum, use a dust mask from a hardware store, a double-layered or microfilter vacuum cleaner bag, or a vacuum cleaner with a HEPA filter.  Replace carpet with wood, tile, or vinyl flooring. Carpet can trap dander and dust.  Use allergy-proof pillows, mattress covers, and box spring covers.  Wash bed sheets and blankets every week in hot water and dry them in a dryer.  Use blankets that are made of polyester or cotton.  Clean bathrooms and kitchens with bleach. If possible, have someone repaint the walls in these rooms with mold-resistant paint. Keep out of the rooms that are being cleaned and painted.  Wash hands frequently. Contact a health care provider if:  You have wheezing, shortness of breath, or a cough even if taking medicine to prevent attacks.  The colored mucus you cough up (sputum) is thicker than usual.  Your sputum changes from clear or white to yellow, green, gray, or bloody.  You have any problems that may be related to the medicines you are taking (such as a rash, itching, swelling, or trouble breathing).  You are using a reliever medicine more than 2-3 times per week.  Your peak flow is still at 50-79% of your personal best after following your action plan for 1 hour.  You have a fever. Get help right away if:  You seem to be getting worse and are unresponsive to treatment during an asthma attack.  You are short of breath even at rest.  You get short of breath when doing very little physical activity.  You have difficulty eating, drinking, or talking due to asthma symptoms.  You develop chest pain.  You develop a fast heartbeat.  You have a bluish color to your lips or fingernails.  You are light-headed, dizzy, or faint.  Your peak flow is less than 50% of your personal best. This information is not intended to replace advice given to  you by your health care provider. Make sure you discuss any questions you have with your health care provider. Document Released: 10/10/2005 Document Revised: 03/23/2016 Document Reviewed: 05/09/2013 Elsevier Interactive Patient Education  2017 [employer]   Asthma Attack Prevention, Adult Although you may not be able to control the fact that you have asthma, you can take actions to prevent episodes of asthma (asthma attacks). These actions include:  Creating a written plan for managing and treating your asthma attacks (asthma action plan).  Monitoring your asthma.  Avoiding things that can irritate your airways or make your asthma symptoms worse (asthma triggers).  Taking your medicines as directed.  Acting quickly if you have signs or symptoms of an asthma attack. What are some ways to prevent an asthma attack? Create a plan  Work with your health care provider to create an asthma action plan. This plan should include:  A list of your asthma triggers and how to avoid them.  A list of symptoms that you experience during an asthma attack.  Information about when to take medicine and how much medicine to take.  Information to help you understand your peak flow measurements.  Contact information for your health care  providers.  Daily actions that you can take to control asthma. Monitor your asthma   To monitor your asthma:  Use your peak flow meter every morning and every evening for 2-3 weeks. Record the results in a journal. A drop in your peak flow numbers on one or more days may mean that you are starting to have an asthma attack, even if you are not having symptoms.  When you have asthma symptoms, write them down in a journal. Avoid asthma triggers   Work with your health care provider to find out what your asthma triggers are. This can be done by:  Being tested for allergies.  Keeping a journal that notes when asthma attacks occur and what may have contributed to  them.  Asking your health care provider whether other medical conditions make your asthma worse. Common asthma triggers include:  Dust.  Smoke. This includes campfire smoke and secondhand smoke from tobacco products.  Pet dander.  Trees, grasses or pollens.  Very cold, dry, or humid air.  Mold.  Foods that contain high amounts of sulfites.  Strong smells.  Engine exhaust and air pollution.  Aerosol sprays and fumes from household cleaners.  Household pests and their droppings, including dust mites and cockroaches.  Certain medicines, including NSAIDs. Once you have determined your asthma triggers, take steps to avoid them. Depending on your triggers, you may be able to reduce the chance of an asthma attack by:  Keeping your home clean. Have someone dust and vacuum your home for you 1 or 2 times a week. If possible, have them use a high-efficiency particulate arrestance (HEPA) vacuum.  Washing your sheets weekly in hot water.  Using allergy-proof mattress covers and casings on your bed.  Keeping pets out of your home.  Taking care of mold and water problems in your home.  Avoiding areas where people smoke.  Avoiding using strong perfumes or odor sprays.  Avoid spending a lot of time outdoors when pollen counts are high and on very windy days.  Talking with your health care provider before stopping or starting any new medicines. Medicines  Take over-the-counter and prescription medicines only as told by your health care provider. Many asthma attacks can be prevented by carefully following your medicine schedule. Taking your medicines correctly is especially important when you cannot avoid certain asthma triggers. Even if you are doing well, do not stop taking your medicine and do not take less medicine. Act quickly  If an asthma attack happens, acting quickly can decrease how severe it is and how long it lasts. Take these actions:  Pay attention to your symptoms. If  you are coughing, wheezing, or having difficulty breathing, do not wait to see if your symptoms go away on their own. Follow your asthma action plan.  If you have followed your asthma action plan and your symptoms are not improving, call your health care provider or seek immediate medical care at the nearest hospital. It is important to write down how often you need to use your fast-acting rescue inhaler. You can track how often you use an inhaler in your journal. If you are using your rescue inhaler more often, it may mean that your asthma is not under control. Adjusting your asthma treatment plan may help you to prevent future asthma attacks and help you to gain better control of your condition. How can I prevent an asthma attack when I exercise?   Exercise is a common asthma trigger. To prevent asthma attacks during  exercise:  Follow advice from your health care provider about whether you should use your fast-acting inhaler before exercising. Many people with asthma experience exercise-induced bronchoconstriction (EIB). This condition often worsens during vigorous exercise in cold, humid, or dry environments. Usually, people with EIB can stay very active by using a fast-acting inhaler before exercising.  Avoid exercising outdoors in very cold or humid weather.  Avoid exercising outdoors when pollen counts are high.  Warm up and cool down when exercising.  Stop exercising right away if asthma symptoms start. Consider taking part in exercises that are less likely to cause asthma symptoms such as:  Indoor swimming.  Biking.  Walking.  Hiking.  Playing football. This information is not intended to replace advice given to you by your health care provider. Make sure you discuss any questions you have with your health care provider. Document Released: 09/28/2009 Document Revised: 06/10/2016 Document Reviewed: 03/26/2016 Elsevier Interactive Patient Education  2017 [employer]  Vaginal  Bleeding During Pregnancy, First Trimester A small amount of bleeding (spotting) from the vagina is relatively common in early pregnancy. It usually stops on its own. Various things may cause bleeding or spotting in early pregnancy. Some bleeding may be related to the pregnancy, and some may not. In most cases, the bleeding is normal and is not a problem. However, bleeding can also be a sign of something serious. Be sure to tell your health care provider about any vaginal bleeding right away. Some possible causes of vaginal bleeding during the first trimester include:  Infection or inflammation of the cervix.  Growths (polyps) on the cervix.  Miscarriage or threatened miscarriage.  Pregnancy tissue has developed outside of the uterus and in a fallopian tube (tubal pregnancy).  Tiny cysts have developed in the uterus instead of pregnancy tissue (molar pregnancy). Follow these instructions at home: Watch your condition for any changes. The following actions may help to lessen any discomfort you are feeling:  Follow your health care provider's instructions for limiting your activity. If your health care provider orders bed rest, you may need to stay in bed and only get up to use the bathroom. However, your health care provider may allow you to continue light activity.  If needed, make plans for someone to help with your regular activities and responsibilities while you are on bed rest.  Keep track of the number of pads you use each day, how often you change pads, and how soaked (saturated) they are. Write this down.  Do not use tampons. Do not douche.  Do not have sexual intercourse or orgasms until approved by your health care provider.  If you pass any tissue from your vagina, save the tissue so you can show it to your health care provider.  Only take over-the-counter or prescription medicines as directed by your health care provider.  Do not take aspirin because it can make you  bleed.  Keep all follow-up appointments as directed by your health care provider. Contact a health care provider if:  You have any vaginal bleeding during any part of your pregnancy.  You have cramps or labor pains.  You have a fever, not controlled by medicine. Get help right away if:  You have severe cramps in your back or belly (abdomen).  You pass large clots or tissue from your vagina.  Your bleeding increases.  You feel light-headed or weak, or you have fainting episodes.  You have chills.  You are leaking fluid or have a gush of fluid from  your vagina.  You pass out while having a bowel movement. This information is not intended to replace advice given to you by your health care provider. Make sure you discuss any questions you have with your health care provider. Document Released: 07/20/2005 Document Revised: 03/17/2016 Document Reviewed: 06/17/2013 Elsevier Interactive Patient Education  2017 [employer].

## 2017-03-14 NOTE — MAU Note (Signed)
Pt to radiology for CXR. 

## 2017-03-14 NOTE — MAU Note (Signed)
Been battling bronchitis since Friday, that's when she found out she was preg.  Started bleeding today, bright red when she wipes, having pain in lower abd.

## 2017-03-14 NOTE — MAU Note (Signed)
Breathing treatment in progress

## 2017-03-15 LAB — RH IG WORKUP (INCLUDES ABO/RH)
ABO/RH(D): O NEG
Antibody Screen: NEGATIVE
Gestational Age(Wks): 6
Unit division: 0

## 2017-03-15 LAB — GC/CHLAMYDIA PROBE AMP (~~LOC~~) NOT AT ARMC
Chlamydia: NEGATIVE
Neisseria Gonorrhea: NEGATIVE

## 2017-03-15 LAB — HIV ANTIBODY (ROUTINE TESTING W REFLEX): HIV SCREEN 4TH GENERATION: NONREACTIVE

## 2017-03-16 ENCOUNTER — Ambulatory Visit: Payer: Medicare HMO | Admitting: *Deleted

## 2017-03-16 DIAGNOSIS — O3680X0 Pregnancy with inconclusive fetal viability, not applicable or unspecified: Secondary | ICD-10-CM

## 2017-03-16 LAB — HCG, QUANTITATIVE, PREGNANCY: HCG, BETA CHAIN, QUANT, S: 15 m[IU]/mL — AB (ref <5)

## 2017-03-16 NOTE — Progress Notes (Signed)
Patient here for stat bhcg today. Patient reports bleeding like a period and mild cramping. Patient reports LMP 02/10/17 with first positive UPT 03/12/17. Patient states bronchitis and bleeding is what brought her in to the [facility]U. Patient will wait for results.

## 2017-03-16 NOTE — Progress Notes (Signed)
Pts hcg level is now 15. Discussed patient with Dr. Adrian BlackwaterStinson and he said that she is miscarrying. She doesn't need any further hcg levels. Pt informed of results. She has no further questions or concerns.

## 2017-03-22 NOTE — MAU Provider Note (Signed)
Chief Complaint: Possible Pregnancy; Vaginal Bleeding; and Abdominal Pain   First Provider Initiated Contact with Patient 03/14/17 1350        SUBJECTIVE HPI: Pamela Duncan is a 36 y.o. G3P0011 at 2289w5d by LMP who presents to maternity admissions reporting bleeding which started today.  Some lower abdominal cramping.  Has been recently treated for bronchitis with Zpack, but is not really feeling better. Chest feels tight,  Has hx of asthma, but no primary care doctor to treat it. Has an old inhaler at home.  . She denies vaginal itching/burning, urinary symptoms, h/a, dizziness, n/v, or fever/chills.     Possible Pregnancy  This is a new problem. The current episode started in the past 7 days. The problem occurs constantly. The problem has been unchanged. Associated symptoms include abdominal pain, congestion and coughing. Pertinent negatives include no chills, fever, headaches, myalgias, nausea, sore throat, urinary symptoms, visual change or vomiting.  Vaginal Bleeding  The patient's primary symptoms include missed menses, pelvic pain and vaginal bleeding. The patient's pertinent negatives include no genital itching, genital lesions or genital odor. This is a new problem. The current episode started today. The problem occurs intermittently. The problem has been unchanged. The pain is mild. The problem affects both sides. She is pregnant. Associated symptoms include abdominal pain. Pertinent negatives include no chills, constipation, diarrhea, dysuria, fever, headaches, nausea, sore throat or vomiting. The vaginal discharge was bloody. The vaginal bleeding is lighter than menses. She has not been passing clots. She has not been passing tissue. Nothing aggravates the symptoms. She has tried nothing for the symptoms.  Abdominal Pain  This is a new problem. The current episode started in the past 7 days. The onset quality is gradual. The problem occurs intermittently. The problem has been unchanged. The  pain is located in the suprapubic region. The quality of the pain is cramping. The abdominal pain does not radiate. Pertinent negatives include no constipation, diarrhea, dysuria, fever, headaches, myalgias, nausea or vomiting. Nothing aggravates the pain. The pain is relieved by nothing.  Cough  This is a recurrent problem. The current episode started 1 to 4 weeks ago. The problem has been unchanged. The problem occurs every few minutes. The cough is non-productive. Associated symptoms include wheezing. Pertinent negatives include no chills, fever, headaches, myalgias, sore throat or shortness of breath. Treatments tried: antibiotic. The treatment provided mild relief. Her past medical history is significant for asthma and bronchitis.    RN Note: Been battling bronchitis since Friday, that's when she found out she was preg.  Started bleeding today, bright red when she wipes, having pain in lower abd.  Past Medical History:  Diagnosis Date  . Abnormal Pap smear    colpo  . ADHD (attention deficit hyperactivity disorder)   . Anxiety   . Asthma   . Depression   . Hypertension    stopped taking medicine  . Insomnia   . Overactive bladder    Past Surgical History:  Procedure Laterality Date  . COLPOSCOPY  2013  . WISDOM TOOTH EXTRACTION     Social History   Social History  . Marital status: Single    Spouse name: N/A  . Number of children: N/A  . Years of education: N/A   Occupational History  . Not on file.   Social History Main Topics  . Smoking status: Current Every Day Smoker    Types: Cigars  . Smokeless tobacco: Never Used  . Alcohol use No  . Drug use:  Yes    Types: Marijuana  . Sexual activity: Yes   Other Topics Concern  . Not on file   Social History Narrative  . No narrative on file   No current facility-administered medications on file prior to encounter.    No current outpatient prescriptions on file prior to encounter.   Allergies  Allergen Reactions   . Codeine Hives  . Latex Rash    I have reviewed patient's Past Medical Hx, Surgical Hx, Family Hx, Social Hx, medications and allergies.   ROS:  Review of Systems  Constitutional: Negative for chills and fever.  HENT: Positive for congestion. Negative for sore throat.   Respiratory: Positive for cough and wheezing. Negative for shortness of breath.   Gastrointestinal: Positive for abdominal pain. Negative for constipation, diarrhea, nausea and vomiting.  Genitourinary: Positive for missed menses, pelvic pain and vaginal bleeding. Negative for dysuria.  Musculoskeletal: Negative for myalgias.  Neurological: Negative for headaches.   Review of Systems  Other systems negative   Physical Exam  Physical Exam No data found.  Constitutional: Well-developed, well-nourished female in no acute distress.  Cardiovascular: normal rate Respiratory: normal effort GI: Abd soft, non-tender. Pos BS x 4 MS: Extremities nontender, no edema, normal ROM Neurologic: Alert and oriented x 4.  GU: Neg CVAT.  PELVIC EXAM: Cervix pink, visually closed, without lesion, scant bloody discharge, vaginal walls and external genitalia normal Bimanual exam: Cervix 0/long/high, firm, anterior, neg CMT, uterus nontender, nonenlarged, adnexa without tenderness, enlargement, or mass   LAB RESULTS   Ref. Range 03/14/2017 14:07  HCG, Beta Chain, Quant, S Latest Ref Range: <5 mIU/mL 29 (H)    Ref. Range 03/14/2017 14:12  WBC Latest Ref Range: 4.0 - 10.5 K/uL 9.2  RBC Latest Ref Range: 3.87 - 5.11 MIL/uL 4.93  Hemoglobin Latest Ref Range: 12.0 - 15.0 g/dL 11.9  HCT Latest Ref Range: 36.0 - 46.0 % 42.9  MCV Latest Ref Range: 78.0 - 100.0 fL 87.0  MCH Latest Ref Range: 26.0 - 34.0 pg 30.4  MCHC Latest Ref Range: 30.0 - 36.0 g/dL 14.7  RDW Latest Ref Range: 11.5 - 15.5 % 12.6  Platelets Latest Ref Range: 150 - 400 K/uL 216   --/--/O NEG (05/22 1413)  IMAGING Dg Chest 1 View  Result Date:  03/14/2017 CLINICAL DATA:  Bronchitis, asthma, shortness of breath, upper respiratory infection, [redacted] weeks pregnant, smoker EXAM: CHEST 1 VIEW COMPARISON:  03/25/2016 FINDINGS: Normal heart size, mediastinal contours, and pulmonary vascularity. Minimal chronic peribronchial thickening. No pulmonary infiltrate, pleural effusion or pneumothorax. Bones unremarkable. IMPRESSION: Minimal chronic bronchitic changes without acute infiltrate. Electronically Signed   By: Ulyses Southward M.D.   On: 03/14/2017 15:50   US Ob Comp Less 14 Wks  Result Date: 03/14/2017 CLINICAL DATA:  36 year old pregnant female presents with vaginal bleeding. Quantitative beta HCG 29. EDC by LMP: 11/17/2017, projecting to an expected gestational age of [redacted] weeks 4 days. EXAM: OBSTETRIC <14 WK [place] AND TRANSVAGINAL OB US TECHNIQUE: Both transabdominal and transvaginal ultrasound examinations were performed for complete evaluation of the gestation as well as the maternal uterus, adnexal regions, and pelvic cul-de-sac. Transvaginal technique was performed to assess early pregnancy. COMPARISON:  No prior scans from this gestation. FINDINGS: Anteverted uterus. No uterine fibroids or other myometrial abnormalities demonstrated. Bilayer endometrial thickness 5 mm. Heterogeneous endometrium with no intrauterine gestational sac, focal endometrial mass or endometrial cavity fluid demonstrated. The ovaries are not visualized on either side on the transabdominal or transvaginal portions of the  scan. No adnexal masses are demonstrated. No free fluid in the pelvis. IMPRESSION: Non-localization of the pregnancy on this scan. No intrauterine gestational sac. Nonvisualization of the ovaries bilaterally. No adnexal masses. Sonographic differential diagnosis includes intrauterine gestation too early to visualize, spontaneous abortion or occult ectopic gestation. Recommend close clinical follow-up and serial serum beta HCG monitoring, with repeat obstetric scan as  warranted by beta HCG levels and clinical assessment. Electronically Signed   By: Delbert Phenix M.D.   On: 03/14/2017 16:57   US Ob Transvaginal  Result Date: 03/14/2017 CLINICAL DATA:  36 year old pregnant female presents with vaginal bleeding. Quantitative beta HCG 29. EDC by LMP: 11/17/2017, projecting to an expected gestational age of [redacted] weeks 4 days. EXAM: OBSTETRIC <14 WK [place] AND TRANSVAGINAL OB US TECHNIQUE: Both transabdominal and transvaginal ultrasound examinations were performed for complete evaluation of the gestation as well as the maternal uterus, adnexal regions, and pelvic cul-de-sac. Transvaginal technique was performed to assess early pregnancy. COMPARISON:  No prior scans from this gestation. FINDINGS: Anteverted uterus. No uterine fibroids or other myometrial abnormalities demonstrated. Bilayer endometrial thickness 5 mm. Heterogeneous endometrium with no intrauterine gestational sac, focal endometrial mass or endometrial cavity fluid demonstrated. The ovaries are not visualized on either side on the transabdominal or transvaginal portions of the scan. No adnexal masses are demonstrated. No free fluid in the pelvis. IMPRESSION: Non-localization of the pregnancy on this scan. No intrauterine gestational sac. Nonvisualization of the ovaries bilaterally. No adnexal masses. Sonographic differential diagnosis includes intrauterine gestation too early to visualize, spontaneous abortion or occult ectopic gestation. Recommend close clinical follow-up and serial serum beta HCG monitoring, with repeat obstetric scan as warranted by beta HCG levels and clinical assessment. Electronically Signed   By: Delbert Phenix M.D.   On: 03/14/2017 16:57    MAU Management/MDM: Ordered usual first trimester r/o ectopic labs.   Pelvic exam and cultures done Will check baseline Ultrasound to rule out ectopic.  Consult Dr Adrian Blackwater with presentation, exam findings, and results.   Treatments in MAU included Albuterol  INAT with good relief of wheezing.  Also gave dose of solumedrol.. Chest xray done to rule out pneumonia, results as above.  Discussed importance of chronic monitoring of asthma as a lifelong condition which needs ongoing care and preventative care.  Will give RhIg due to bleeding and Rh Neg.  This bleeding/pain can represent a normal pregnancy with bleeding, spontaneous abortion or even an ectopic which can be life-threatening.  The process as listed above helps to determine which of these is present.    ASSESSMENT 1. Bleeding in early pregnancy   2. Upper respiratory infection   3. Asthma affecting pregnancy, antepartum   4. Intermittent asthma with acute exacerbation, unspecified asthma severity   5.    Rh Negative in pregnancy  PLAN Discharge home Plan to repeat HCG level in 48 hours in clinic per 11:00 am schedule Will repeat  Ultrasound in about 7-10 days if HCG levels double appropriately  Ectopic precautions Encouraged Mucinex for home use.    Follow-up Information    [facility]. Go in 2 day(s).   Contact information: [address] [place] [address] [phone]         Pt stable at time of discharge. Encouraged to return here or to other Urgent Care/ED if she develops worsening of symptoms, increase in pain, fever, or other concerning symptoms.    Wynelle Bourgeois CNM, MSN Certified Nurse-Midwife 03/22/2017  8:03 AM

## 2017-07-26 ENCOUNTER — Encounter: Payer: Self-pay | Admitting: *Deleted

## 2017-08-10 ENCOUNTER — Ambulatory Visit (INDEPENDENT_AMBULATORY_CARE_PROVIDER_SITE_OTHER): Payer: Medicare HMO | Admitting: Obstetrics & Gynecology

## 2017-08-10 ENCOUNTER — Encounter: Payer: Self-pay | Admitting: Obstetrics & Gynecology

## 2017-08-10 ENCOUNTER — Other Ambulatory Visit (HOSPITAL_COMMUNITY)
Admission: RE | Admit: 2017-08-10 | Discharge: 2017-08-10 | Disposition: A | Discharge status: Home / Self Care | Payer: Medicare HMO | Source: Ambulatory Visit | Attending: Obstetrics & Gynecology | Admitting: Obstetrics & Gynecology

## 2017-08-10 DIAGNOSIS — O26899 Other specified pregnancy related conditions, unspecified trimester: Secondary | ICD-10-CM

## 2017-08-10 DIAGNOSIS — Z3480 Encounter for supervision of other normal pregnancy, unspecified trimester: Secondary | ICD-10-CM | POA: Insufficient documentation

## 2017-08-10 DIAGNOSIS — O09529 Supervision of elderly multigravida, unspecified trimester: Secondary | ICD-10-CM

## 2017-08-10 DIAGNOSIS — I10 Essential (primary) hypertension: Secondary | ICD-10-CM

## 2017-08-10 DIAGNOSIS — F419 Anxiety disorder, unspecified: Secondary | ICD-10-CM | POA: Insufficient documentation

## 2017-08-10 DIAGNOSIS — F32A Depression, unspecified: Secondary | ICD-10-CM | POA: Insufficient documentation

## 2017-08-10 DIAGNOSIS — Z113 Encounter for screening for infections with a predominantly sexual mode of transmission: Secondary | ICD-10-CM | POA: Diagnosis not present

## 2017-08-10 DIAGNOSIS — N3281 Overactive bladder: Secondary | ICD-10-CM | POA: Insufficient documentation

## 2017-08-10 DIAGNOSIS — O169 Unspecified maternal hypertension, unspecified trimester: Secondary | ICD-10-CM | POA: Insufficient documentation

## 2017-08-10 DIAGNOSIS — Z6791 Unspecified blood type, Rh negative: Secondary | ICD-10-CM | POA: Insufficient documentation

## 2017-08-10 DIAGNOSIS — F329 Major depressive disorder, single episode, unspecified: Secondary | ICD-10-CM

## 2017-08-10 DIAGNOSIS — Z8679 Personal history of other diseases of the circulatory system: Secondary | ICD-10-CM

## 2017-08-10 DIAGNOSIS — O99340 Other mental disorders complicating pregnancy, unspecified trimester: Secondary | ICD-10-CM

## 2017-08-10 LAB — POCT URINALYSIS DIP (DEVICE)
Bilirubin Urine: NEGATIVE
GLUCOSE, UA: NEGATIVE mg/dL
Hgb urine dipstick: NEGATIVE
KETONES UR: NEGATIVE mg/dL
LEUKOCYTES UA: NEGATIVE
Nitrite: NEGATIVE
PROTEIN: NEGATIVE mg/dL
SPECIFIC GRAVITY, URINE: 1.02 (ref 1.005–1.030)
UROBILINOGEN UA: 0.2 mg/dL (ref 0.0–1.0)
pH: 7 (ref 5.0–8.0)

## 2017-08-10 MED ORDER — ONDANSETRON 4 MG PO TBDP: 4.0000 mg | ORAL_TABLET | Freq: Four times a day (QID) | ORAL | 0 refills | Status: DC | PRN

## 2017-08-10 NOTE — Progress Notes (Signed)
Elevated phq9 and gad 7 , declined to see our behavioral health clinician. States she has her own counselor who she will see next week.

## 2017-08-10 NOTE — Progress Notes (Signed)
Subjective:    Pamela Duncan is a [id number] Unknown being seen today for her first obstetrical visit.  Her obstetrical history is significant for 2 miscarriages this year. Patient does intend to breast feed. Pregnancy history fully reviewed.  Patient reports nausea and vomiting.  Vitals:   08/10/17 0830  BP: 126/69  Pulse: 78  Weight: 189 lb 4.8 oz (85.9 kg)    HISTORY: OB History  Gravida Para Term Preterm AB Living  5 1 1  0 3 1  SAB TAB Ectopic Multiple Live Births  3 0 0 0 1    # Outcome Date GA Lbr Len/2nd Weight Sex Delivery Anes PTL Lv  5 Current           4 SAB 04/2017 4228w0d            Birth Comments: System Generated. Please review and update pregnancy details.  3 SAB 02/2017 3428w0d         2 Term 04/07/13 6837w2d  6 lb 8 oz (2.948 kg) M Vag-Spont   LIV     Birth Comments: born face up  1 SAB 02/2011 4019w0d            Past Medical History:  Diagnosis Date  . Abnormal Pap smear    colpo  . ADHD (attention deficit hyperactivity disorder)   . Anxiety   . Asthma   . Degenerative disc disease, lumbar   . Depression   . Hypertension    stopped taking medicine  . Insomnia   . Overactive bladder   . Overactive bladder   . Vaginal Pap smear, abnormal    Past Surgical History:  Procedure Laterality Date  . COLPOSCOPY  2013  . WISDOM TOOTH EXTRACTION     Family History  Problem Relation Age of Onset  . Heart disease Mother   . Lung cancer Father   . Other Neg Hx      Exam    Uterus:     Pelvic Exam:    Perineum: No Hemorrhoids   Vulva: normal   Vagina:  normal mucosa   pH:     Cervix: no lesions   Adnexa: not evaluated   Bony Pelvis: average  System: Breast:  normal appearance, no masses or tenderness   Skin: normal coloration and turgor, no rashes    Neurologic: oriented, normal mood   Extremities: normal strength, tone, and muscle mass   HEENT extra ocular movement intact   Mouth/Teeth mucous membranes moist, pharynx normal without lesions and  dental hygiene good   Neck supple   Cardiovascular: regular rate and rhythm, no murmurs or gallops   Respiratory:  appears well, vitals normal, no respiratory distress, acyanotic, normal RR, neck free of mass or lymphadenopathy, chest clear, no wheezing, crepitations, rhonchi, normal symmetric air entry   Abdomen: soft, non-tender; bowel sounds normal; no masses,  no organomegaly          Assessment:    Pregnancy: [id number] Patient Active Problem List   Diagnosis Date Noted  . Supervision of other normal pregnancy, antepartum 08/10/2017  . Rh negative state in antepartum period 08/10/2017  . Anxiety disorder affecting pregnancy, antepartum 08/10/2017        Plan:     Initial labs drawn. Prenatal vitamins. Problem list reviewed and updated. Genetic Screening discussed Quad Screen: need dating.  Ultrasound discussed; fetal survey: ordered.  Follow up in 4 weeks. 50% of 30 min visit spent on counseling and coordination of care.  S> D  Scheryl Darter 08/10/2017

## 2017-08-10 NOTE — Progress Notes (Signed)
Here for Initial prenatal care. C/o pain in lower back down her buttocks,down her leg more on left side. Declines flu shot for now.  Given new patient information. Signed release for pap records from [facility]. States did not get rhogam after miscarriage because she did not go to md.

## 2017-08-10 NOTE — Patient Instructions (Signed)

## 2017-08-11 LAB — GC/CHLAMYDIA PROBE AMP (~~LOC~~) NOT AT ARMC
Chlamydia: NEGATIVE
NEISSERIA GONORRHEA: NEGATIVE

## 2017-08-12 LAB — CULTURE, OB URINE

## 2017-08-12 LAB — URINE CULTURE, OB REFLEX

## 2017-08-16 ENCOUNTER — Encounter: Payer: Self-pay | Admitting: Obstetrics & Gynecology

## 2017-08-16 ENCOUNTER — Other Ambulatory Visit: Payer: Self-pay | Admitting: Obstetrics & Gynecology

## 2017-08-16 ENCOUNTER — Ambulatory Visit (HOSPITAL_COMMUNITY)
Admission: RE | Admit: 2017-08-16 | Discharge: 2017-08-16 | Disposition: A | Discharge status: Home / Self Care | Payer: Non-veteran care | Source: Ambulatory Visit | Attending: Obstetrics & Gynecology | Admitting: Obstetrics & Gynecology

## 2017-08-16 DIAGNOSIS — F419 Anxiety disorder, unspecified: Secondary | ICD-10-CM

## 2017-08-16 DIAGNOSIS — Z3A14 14 weeks gestation of pregnancy: Secondary | ICD-10-CM

## 2017-08-16 DIAGNOSIS — I10 Essential (primary) hypertension: Secondary | ICD-10-CM

## 2017-08-16 DIAGNOSIS — O10011 Pre-existing essential hypertension complicating pregnancy, first trimester: Secondary | ICD-10-CM | POA: Insufficient documentation

## 2017-08-16 DIAGNOSIS — O99340 Other mental disorders complicating pregnancy, unspecified trimester: Secondary | ICD-10-CM

## 2017-08-16 DIAGNOSIS — Z3687 Encounter for antenatal screening for uncertain dates: Secondary | ICD-10-CM | POA: Diagnosis not present

## 2017-08-16 DIAGNOSIS — Z3480 Encounter for supervision of other normal pregnancy, unspecified trimester: Secondary | ICD-10-CM

## 2017-08-16 DIAGNOSIS — Z3491 Encounter for supervision of normal pregnancy, unspecified, first trimester: Secondary | ICD-10-CM

## 2017-08-16 DIAGNOSIS — Z3682 Encounter for antenatal screening for nuchal translucency: Secondary | ICD-10-CM

## 2017-08-16 DIAGNOSIS — O09521 Supervision of elderly multigravida, first trimester: Secondary | ICD-10-CM

## 2017-08-16 DIAGNOSIS — Z6791 Unspecified blood type, Rh negative: Secondary | ICD-10-CM

## 2017-08-16 DIAGNOSIS — N3281 Overactive bladder: Secondary | ICD-10-CM

## 2017-08-16 DIAGNOSIS — F32A Depression, unspecified: Secondary | ICD-10-CM

## 2017-08-16 DIAGNOSIS — O09529 Supervision of elderly multigravida, unspecified trimester: Secondary | ICD-10-CM

## 2017-08-16 DIAGNOSIS — O26899 Other specified pregnancy related conditions, unspecified trimester: Secondary | ICD-10-CM

## 2017-08-16 DIAGNOSIS — O99341 Other mental disorders complicating pregnancy, first trimester: Secondary | ICD-10-CM | POA: Insufficient documentation

## 2017-08-16 DIAGNOSIS — Z8679 Personal history of other diseases of the circulatory system: Secondary | ICD-10-CM

## 2017-08-16 DIAGNOSIS — Z3A13 13 weeks gestation of pregnancy: Secondary | ICD-10-CM | POA: Insufficient documentation

## 2017-08-16 DIAGNOSIS — F329 Major depressive disorder, single episode, unspecified: Secondary | ICD-10-CM

## 2017-08-17 ENCOUNTER — Encounter: Payer: Self-pay | Admitting: Obstetrics & Gynecology

## 2017-08-17 ENCOUNTER — Encounter: Payer: Medicare HMO | Admitting: Obstetrics and Gynecology

## 2017-08-17 ENCOUNTER — Other Ambulatory Visit (HOSPITAL_COMMUNITY): Payer: Self-pay | Admitting: *Deleted

## 2017-08-17 DIAGNOSIS — O09522 Supervision of elderly multigravida, second trimester: Secondary | ICD-10-CM

## 2017-08-17 LAB — OBSTETRIC PANEL, INCLUDING HIV
ANTIBODY SCREEN: NEGATIVE
BASOS: 0 %
Basophils Absolute: 0 10*3/uL (ref 0.0–0.2)
EOS (ABSOLUTE): 0.1 10*3/uL (ref 0.0–0.4)
EOS: 1 %
HEMATOCRIT: 41.2 % (ref 34.0–46.6)
HEMOGLOBIN: 13.3 g/dL (ref 11.1–15.9)
HIV SCREEN 4TH GENERATION: NONREACTIVE
Hepatitis B Surface Ag: NEGATIVE
Immature Grans (Abs): 0 10*3/uL (ref 0.0–0.1)
Immature Granulocytes: 0 %
Lymphocytes Absolute: 2.3 10*3/uL (ref 0.7–3.1)
Lymphs: 20 %
MCH: 30.2 pg (ref 26.6–33.0)
MCHC: 32.3 g/dL (ref 31.5–35.7)
MCV: 93 fL (ref 79–97)
MONOS ABS: 0.6 10*3/uL (ref 0.1–0.9)
Monocytes: 5 %
NEUTROS ABS: 8.4 10*3/uL — AB (ref 1.4–7.0)
Neutrophils: 74 %
Platelets: 209 10*3/uL (ref 150–379)
RBC: 4.41 x10E6/uL (ref 3.77–5.28)
RDW: 13 % (ref 12.3–15.4)
RH TYPE: NEGATIVE
RPR Ser Ql: NONREACTIVE
Rubella Antibodies, IGG: 4.28 index (ref >0.99)
WBC: 11.5 10*3/uL — ABNORMAL HIGH (ref 3.4–10.8)

## 2017-08-17 LAB — CYSTIC FIBROSIS GENE TEST

## 2017-08-25 ENCOUNTER — Other Ambulatory Visit: Payer: Self-pay

## 2017-08-28 ENCOUNTER — Telehealth (HOSPITAL_COMMUNITY): Payer: Self-pay | Admitting: MS"

## 2017-08-28 NOTE — Telephone Encounter (Signed)
Called Pamela Duncan to discuss her prenatal cell free DNA test results.  Ms. Pamela Duncan had Panorama testing through [facility].  Testing was offered because of advanced maternal age.   The patient was identified by name and DOB.  We reviewed that these are within normal limits, showing a less than 1 in 10,000 risk for trisomies 21, 18 and 13, and monosomy X (Turner syndrome).  In addition, the risk for triploidy and sex chromosome trisomies (47,XXX and 47,XXY) was also low risk.  We reviewed that this testing identifies > 99% of pregnancies with trisomy 1821, trisomy 3413, sex chromosome trisomies (47,XXX and 47,XXY), and triploidy. The detection rate for trisomy 18 is 96%.  The detection rate for monosomy X is ~92%.  The false positive rate is <0.1% for all conditions. Testing was also consistent with female fetal sex.  The patient did wish to know fetal sex.  She understands that this testing does not identify all genetic conditions.  All questions were answered to her satisfaction, she was encouraged to call with additional questions or concerns.  Quinn PlowmanKaren Latarra Eagleton, MS Certified Genetic Counselor 08/28/2017 9:20 AM

## 2017-08-31 ENCOUNTER — Other Ambulatory Visit: Payer: Self-pay | Admitting: *Deleted

## 2017-08-31 DIAGNOSIS — O09522 Supervision of elderly multigravida, second trimester: Secondary | ICD-10-CM

## 2017-08-31 DIAGNOSIS — Z8679 Personal history of other diseases of the circulatory system: Secondary | ICD-10-CM

## 2017-08-31 DIAGNOSIS — Z3480 Encounter for supervision of other normal pregnancy, unspecified trimester: Secondary | ICD-10-CM

## 2017-09-06 ENCOUNTER — Encounter (HOSPITAL_COMMUNITY): Payer: Self-pay | Admitting: Obstetrics & Gynecology

## 2017-09-07 ENCOUNTER — Encounter: Payer: Medicare HMO | Admitting: Obstetrics & Gynecology

## 2017-09-12 ENCOUNTER — Encounter: Payer: Medicare HMO | Admitting: Advanced Practice Midwife

## 2017-09-13 ENCOUNTER — Ambulatory Visit (HOSPITAL_COMMUNITY)
Admission: RE | Admit: 2017-09-13 | Discharge: 2017-09-13 | Disposition: A | Discharge status: Home / Self Care | Payer: Non-veteran care | Source: Ambulatory Visit | Attending: Obstetrics & Gynecology | Admitting: Obstetrics & Gynecology

## 2017-09-13 ENCOUNTER — Other Ambulatory Visit (HOSPITAL_COMMUNITY): Payer: Self-pay | Admitting: Maternal and Fetal Medicine

## 2017-09-13 ENCOUNTER — Other Ambulatory Visit (HOSPITAL_COMMUNITY): Payer: Self-pay | Admitting: *Deleted

## 2017-09-13 ENCOUNTER — Encounter (HOSPITAL_COMMUNITY): Payer: Self-pay

## 2017-09-13 DIAGNOSIS — O09522 Supervision of elderly multigravida, second trimester: Secondary | ICD-10-CM | POA: Insufficient documentation

## 2017-09-13 DIAGNOSIS — O10012 Pre-existing essential hypertension complicating pregnancy, second trimester: Secondary | ICD-10-CM | POA: Insufficient documentation

## 2017-09-13 DIAGNOSIS — Z3689 Encounter for other specified antenatal screening: Secondary | ICD-10-CM

## 2017-09-13 DIAGNOSIS — O09529 Supervision of elderly multigravida, unspecified trimester: Secondary | ICD-10-CM

## 2017-09-13 DIAGNOSIS — Z3A17 17 weeks gestation of pregnancy: Secondary | ICD-10-CM | POA: Insufficient documentation

## 2017-09-25 ENCOUNTER — Ambulatory Visit (INDEPENDENT_AMBULATORY_CARE_PROVIDER_SITE_OTHER): Payer: Medicare HMO | Admitting: Advanced Practice Midwife

## 2017-09-25 VITALS — BP 103/58 | HR 78 | Wt 197.4 lb

## 2017-09-25 DIAGNOSIS — Z23 Encounter for immunization: Secondary | ICD-10-CM

## 2017-09-25 DIAGNOSIS — I10 Essential (primary) hypertension: Secondary | ICD-10-CM

## 2017-09-25 DIAGNOSIS — Z3480 Encounter for supervision of other normal pregnancy, unspecified trimester: Secondary | ICD-10-CM

## 2017-09-25 DIAGNOSIS — Z3482 Encounter for supervision of other normal pregnancy, second trimester: Secondary | ICD-10-CM

## 2017-09-25 MED ORDER — ONDANSETRON 4 MG PO TBDP: 4.0000 mg | ORAL_TABLET | Freq: Three times a day (TID) | ORAL | 3 refills | Status: DC | PRN

## 2017-09-25 MED ORDER — ASPIRIN EC 81 MG PO TBEC: 81.0000 mg | DELAYED_RELEASE_TABLET | Freq: Every day | ORAL | 9 refills | Status: DC

## 2017-09-25 NOTE — Patient Instructions (Signed)
Childbirth Education Options: [facility] Department Classes:  Childbirth education classes can help you get ready for a positive parenting experience. You can also meet other expectant parents and get free stuff for your baby. Each class runs for five weeks on the same night and costs $45 for the mother-to-be and her support person. Medicaid covers the cost if you are eligible. Call [phone] to register. [facility] Childbirth Education:  [phone] or [phone] or [email or web address]  Baby & Me Class: Discuss newborn & infant parenting and family adjustment issues with other new mothers in a relaxed environment. Each week brings a new speaker or baby-centered activity. We encourage new mothers to join [place] every Thursday at 11:00am. Babies birth until crawling. No registration or fee. Daddy [employer]: This course offers Dads-to-be the tools and knowledge needed to feel confident on their journey to becoming new fathers. Experienced dads, who have been trained as coaches, teach dads-to-be how to hold, comfort, diaper, swaddle and play with their infant while being able to support the new mom as well. A class for men taught by men. $25/dad Big Brother/Big Sister: Let your children share in the joy of a new brother or sister in this special class designed just for them. Class includes discussion about how families care for babies: swaddling, holding, diapering, safety as well as how they can be helpful in their new role. This class is designed for children ages 44 to 66, but any age is welcome. Please register each child individually. $5/child  Mom Talk: This mom-led group offers support and connection to mothers as they journey through the adjustments and struggles of that sometimes overwhelming first year after the birth of a child. Tuesdays at 10:00am and Thursdays at 6:00pm. Babies welcome. No registration or fee. Breastfeeding Support Group: This group is a mother-to-mother  support circle where moms have the opportunity to share their breastfeeding experiences. A Lactation Consultant is present for questions and concerns. Meets each Tuesday at 11:00am. No fee or registration. Breastfeeding Your Baby: Learn what to expect in the first days of breastfeeding your newborn.  This class will help you feel more confident with the skills needed to begin your breastfeeding experience. Many new mothers are concerned about breastfeeding after leaving the hospital. This class will also address the most common fears and challenges about breastfeeding during the first few weeks, months and beyond. (call for fee) Comfort Techniques and Tour: This 2 hour interactive class will provide you the opportunity to learn & practice hands-on techniques that can help relieve some of the discomfort of labor and encourage your baby to rotate toward the best position for birth. You and your partner will be able to try a variety of labor positions with birth balls and rebozos as well as practice breathing, relaxation, and visualization techniques. A tour of the [facility] is included with this class. $20 per registrant and support person Childbirth Class- Weekend Option: This class is a Weekend version of our Birth & Baby series. It is designed for parents who have a difficult time fitting several weeks of classes into their schedule. It covers the care of your newborn and the basics of labor and childbirth. It also includes a [place] [facility] and lunch. The class is held two consecutive days: beginning on Friday evening from 6:30 - 8:30 p.m. and the next day, Saturday from 9 a.m. - 4 p.m. (call for fee) Doren Custard Class: Interested in a waterbirth?  This  informational class will help you discover whether waterbirth is the right fit for you. Education about waterbirth itself, supplies you would need and how to assemble your support team is what you can  expect from this class. Some obstetrical practices require this class in order to pursue a waterbirth. (Not all obstetrical practices offer waterbirth-check with your healthcare provider.) Register only the expectant mom, but you are encouraged to bring your partner to class! Required if planning waterbirth, no fee. Infant/Child CPR: Parents, grandparents, babysitters, and friends learn Cardio-Pulmonary Resuscitation skills for infants and children. You will also learn how to treat both conscious and unconscious choking in infants and children. This Family & Friends program does not offer certification. Register each participant individually to ensure that enough mannequins are available. (Call for fee) Grandparent Love: Expecting a grandbaby? This class is for you! Learn about the latest infant care and safety recommendations and ways to support your own child as he or she transitions into the parenting role. Taught by Registered Nurses who are childbirth instructors, but most importantly...they are grandmothers too! $10/person. Childbirth Class- Natural Childbirth: This series of 5 weekly classes is for expectant parents who want to learn and practice natural methods of coping with the process of labor and childbirth. Relaxation, breathing, massage, visualization, role of the partner, and helpful positioning are highlighted. Participants learn how to be confident in their body's ability to give birth. This class will empower and help parents make informed decisions about their own care. Includes discussion that will help new parents transition into the immediate postpartum period. Maternity Care Center Tour of [facility] is included. We suggest taking this class between 25-32 weeks, but it's only a recommendation. $75 per registrant and one support person or $30 Medicaid. Childbirth Class- 3 week Series: This option of 3 weekly classes helps you and your labor partner prepare for childbirth. Newborn  care, labor & birth, cesarean birth, pain management, and comfort techniques are discussed and a Maternity Care Center Tour of [facility] is included. The class meets at the same time, on the same day of the week for 3 consecutive weeks beginning with the starting date you choose. $60 for registrant and one support person.  Marvelous Multiples: Expecting twins, triplets, or more? This class covers the differences in labor, birth, parenting, and breastfeeding issues that face multiples' parents. NICU tour is included. Led by a Certified Childbirth Educator who is the mother of twins. No fee. Caring for Baby: This class is for expectant and adoptive parents who want to learn and practice the most up-to-date newborn care for their babies. Focus is on birth through the first six weeks of life. Topics include feeding, bathing, diapering, crying, umbilical cord care, circumcision care and safe sleep. Parents learn to recognize symptoms of illness and when to call the pediatrician. Register only the mom-to-be and your partner or support person can plan to come with you! $10 per registrant and support person Childbirth Class- online option: This online class offers you the freedom to complete a Birth and Baby series in the comfort of your own home. The flexibility of this option allows you to review sections at your own pace, at times convenient to you and your support people. It includes additional video information, animations, quizzes, and extended activities. Get organized with helpful eClass tools, checklists, and trackers. Once you register online for the class, you will receive an email within a few days to accept the invitation and begin the class when the time   is right for you. The content will be available to you for 60 days. $60 for 60 days of online access for you and your support people.  Local Doulas: Natural Baby Doulas [email or web address] Tel:  [phone] [email or web address]/ [employer] [phone] [email or web address] .[email or web address] www.piedmontdoulas.com The Labor Hassell Halim  (also do waterbirth tub rental) [phone] [email or web address] https://www.[email or web address]/ Triad Birth Doula [phone] [email or web address] NotebookDistributors.fi Sacred Rhythms  [phone] https://sacred-rhythms.com/ [employer] (PADA) [email or web address] https://www.frey.org/ [place] Birth and Baby  http://[email or web address]/ Considering Waterbirth? Guide for patients at [facility] [employer]  Why consider waterbirth?  . Gentle birth for babies . Less pain medicine used in labor . May allow for passive descent/less pushing . May reduce perineal tears  . More mobility and instinctive maternal position changes . Increased maternal relaxation . Reduced blood pressure in labor  Is waterbirth safe? What are the risks of infection, drowning or other complications?  . Infection: o Very low risk (3.7 % for tub vs 4.8% for bed) o 7 in 8000 waterbirths with documented infection o Poorly cleaned equipment most common cause o Slightly lower group B strep transmission rate  . Drowning o Maternal:  - Very low risk   - Related to seizures or fainting o Newborn:  - Very low risk. No evidence of increased risk of respiratory problems in multiple large studies - Physiological protection from breathing under water - Avoid underwater birth if there are any fetal complications - Once baby's head is out of the water, keep it out.  . Birth complication o Some reports of cord trauma, but risk decreased by bringing baby to surface gradually o No evidence of increased risk of shoulder dystocia. Mothers can usually change positions faster in water than in a bed, possibly aiding the maneuvers to free the shoulder.   You must attend a Doren Custard class at [facility]  3rd Wednesday of every month from 7-9pm  [employer] by calling [phone] or online at [email or web address]  Bring [place] the certificate from the class to your prenatal appointment  Meet with a midwife at 36 weeks to see if you can still plan a waterbirth and to sign the consent.   Purchase or rent the following supplies:   Water Birth Pool (Birth Pool in a Box or [place] for instance)  (Tubs start ~$125)  Single-use disposable tub liner designed for your brand of tub  New garden hose labeled "lead-free", "suitable for drinking water",  Electric drain pump to remove water (We recommend 792 gallon per hour or greater pump.)   Separate garden hose to remove the dirty water  Fish net  Bathing suit top (optional)  Long-handled mirror (optional)  Places to purchase or rent supplies  GotWebTools.is for tub purchases and supplies  Waterbirthsolutions.com for tub purchases and supplies  The Labor Ladies (www.thelaborladies.com) $275 for tub rental/set-up & take down/kit   [employer] (http://www.fleming.com/.htm) Information regarding doulas (labor support) who provide pool rentals  Our practice has a Birth Pool in a Box tub at the hospital that you may borrow on a first-come-first-served basis. It is your responsibility to to set up, clean and break down the tub. We cannot guarantee the availability of this tub in advance. You are responsible for bringing all accessories listed above. If you do not have all necessary supplies you cannot have a waterbirth.    Things that would prevent you from having a waterbirth:  Premature, <37wks  Previous cesarean birth  Presence of thick meconium-stained fluid  Multiple gestation (Twins,  triplets, etc.)  Uncontrolled diabetes or gestational diabetes requiring medication  Hypertension requiring medication or diagnosis of pre-eclampsia  Heavy vaginal bleeding  Non-reassuring fetal  heart rate  Active infection (MRSA, etc.). Group B Strep is NOT a contraindication for  waterbirth.  If your labor has to be induced and induction method requires continuous  monitoring of the baby's heart rate  Other risks/issues identified by your obstetrical provider  Please remember that birth is unpredictable. Under certain unforeseeable circumstances your provider may advise against giving birth in the tub. These decisions will be made on a case-by-case basis and with the safety of you and your baby as our highest priority.    Places to have your son circumcised:    [facility] [phone] while you are in hospital  [facility] [phone] $244 by 4 wks  [facility] [phone] $175 by 2 wks  [facility] [phone] $250 by 7 days [facility] [phone] $150 by 4 wks  These prices sometimes change but are roughly what you can expect to pay. Please call and confirm pricing.   Circumcision is considered an elective/non-medically necessary procedure. There are many reasons parents decide to have their sons circumsized. During the first year of life circumcised males have a reduced risk of urinary tract infections but after this year the rates between circumcised males and uncircumcised males are the same.  It is safe to have your son circumcised outside of the hospital and the places above perform them regularly.  AREA PED[facility]AT[facility]IC[facility]ILY [place] [address] [place], [place]  [address] Phone - [phone]   Fax - [phone]  [facility] [address] [place], [place] [address] Phone - [phone]   Fax - [place] [address] [place], [place]  [address] Phone - [phone]   Fax - [phone]  [place]. [address], [place] [place], [place]  [address] Phone - [phone]   Fax - [phone]  [place] [address] [place], [place]  [address] Phone - [phone]   Fax - [phone]  [facility] [address] [place], [place]  [address] Phone - [phone]   Fax - [place] [address], [place], [place]  [address] Phone - [phone]   Fax - [phone]  [place] [address] [place], [place] [address] [place], [place]  [address] Phone - [phone]   Fax - [place] [address] [place], [place]  [address] Phone - [phone]   Fax - [phone] [facility] [place]. [address] [place], [place]  [address] Phone - [phone]   Fax - [phone]  [place] [address] [place], [place]  [address] Phone - [phone]   Fax - [phone]  [facility] AT [place], [place], [place]  [address] Phone - [phone]   Fax - [place] [address] [place], [place]  [address] Phone - [phone]   Fax - [phone]  [facility] [address], [place], [place]  [address] Phone - [place], [place]  [address] Phone - [phone]   Fax - [place] [address] [place], [place]  [address] Phone - [phone]  Fax - [phone]  [place] [address] [place], [place]  [address] Phone - [phone]   Fax - [place]. [place], [place]  [address] Phone - [phone]   Fax - [place], [place], [place]  [address] Phone -  [phone]   Fax - [place] [address] [place], [place]  [address] Phone - [phone]   Fax - [phone]  [place] [address] [place], [place]  [address] Phone - [phone]   Fax - [place] [place], [place]  [address] Phone - [phone]   Fax - [phone]  [place] [address] [place], [place]  [address] Phone - [phone]   Fax - [phone] [place] [address] [place], [place]  [address] Phone - [phone]   Fax - [place] [address] [place], [place]  [address] Phone - [phone]   Fax - [place] [address] [place], [place]  [address] Phone - [phone]   Fax - [phone]  [place] MD [address] [place] [address] Phone [phone]  Fax [phone]

## 2017-09-25 NOTE — Progress Notes (Addendum)
   PRENATAL VISIT NOTE  Subjective:  Pamela Duncan is a 36 y.o. [id number] at 3757w3d being seen today for ongoing prenatal care.  She is currently monitored for the following issues for this low-risk pregnancy and has Supervision of other normal pregnancy, antepartum; Rh negative state in antepartum period; Anxiety disorder affecting pregnancy, antepartum; Depression; Overactive bladder; Hypertension; Advanced maternal age in multigravida; and History of hypertension on their problem list.  Patient reports no complaints.  Contractions: Not present. Vag. Bleeding: None.  Movement: Present. Denies leaking of fluid.   The following portions of the patient's history were reviewed and updated as appropriate: allergies, current medications, past family history, past medical history, past social history, past surgical history and problem list. Problem list updated.  Objective:   Vitals:   09/25/17 1126  BP: (!) 103/58  Pulse: 78  Weight: 197 lb 6.4 oz (89.5 kg)    Fetal Status: Fetal Heart Rate (bpm): 156 Fundal Height: 21 cm Movement: Present     General:  Alert, oriented and cooperative. Patient is in no acute distress.  Skin: Skin is warm and dry. No rash noted.   Cardiovascular: Normal heart rate noted  Respiratory: Normal respiratory effort, no problems with respiration noted  Abdomen: Soft, gravid, appropriate for gestational age.  Pain/Pressure: Present     Pelvic: Cervical exam deferred        Extremities: Normal range of motion.  Edema: None  Mental Status:  Normal mood and affect. Normal behavior. Normal judgment and thought content.   Assessment and Plan:  Pregnancy: G5P1031 at 7457w3d  1. Supervision of other normal pregnancy, antepartum - FU US with MFM in January  2. Hypertension, unspecified type - Not on any meds. Patient states that was a temporary occurrence due to anxiety. She was on meds for about 4 months, but has not been on any meds for about 7 years.  - Started on  baby ASA  - Flu vax today   Preterm labor symptoms and general obstetric precautions including but not limited to vaginal bleeding, contractions, leaking of fluid and fetal movement were reviewed in detail with the patient. Please refer to After Visit Summary for other counseling recommendations.  Return in about 4 weeks (around 10/23/2017).   Thressa ShellerHeather Brandace Cargle, CNM

## 2017-10-23 ENCOUNTER — Encounter: Payer: Medicare HMO | Admitting: Advanced Practice Midwife

## 2017-10-24 NOTE — L&D Delivery Note (Addendum)
Delivery Note At 9:07 PM a viable and healthy female was delivered via Vaginal, Spontaneous (Presentation:LOA ; cephalic  ).  APGAR: 7, 9; Placenta status: spontaneous, intact .  Cord: 3 vessel Complications: loose nuchal x1, easily reduced  Anesthesia: IV fentanyl  Episiotomy: None Lacerations: 2nd degree perineal Suture Repair: 3.0 monocryl Est. Blood Loss (mL): 150  Mom to postpartum.  Baby to Couplet care / Skin to Skin.  Pamela Duncan 02/14/2018, 9:21 PM  I was present and gloved with the resident for the entire delivery. I agree with the above note.  Thressa ShellerHeather Hogan 10:49 PM 02/14/18

## 2017-10-31 ENCOUNTER — Ambulatory Visit (INDEPENDENT_AMBULATORY_CARE_PROVIDER_SITE_OTHER): Payer: Medicare HMO | Admitting: Obstetrics and Gynecology

## 2017-10-31 VITALS — BP 114/63 | HR 75 | Wt 200.6 lb

## 2017-10-31 DIAGNOSIS — O99612 Diseases of the digestive system complicating pregnancy, second trimester: Secondary | ICD-10-CM

## 2017-10-31 DIAGNOSIS — K5900 Constipation, unspecified: Secondary | ICD-10-CM

## 2017-10-31 DIAGNOSIS — Z6791 Unspecified blood type, Rh negative: Secondary | ICD-10-CM

## 2017-10-31 DIAGNOSIS — Z3480 Encounter for supervision of other normal pregnancy, unspecified trimester: Secondary | ICD-10-CM

## 2017-10-31 DIAGNOSIS — O09899 Supervision of other high risk pregnancies, unspecified trimester: Secondary | ICD-10-CM

## 2017-10-31 DIAGNOSIS — F5101 Primary insomnia: Secondary | ICD-10-CM

## 2017-10-31 DIAGNOSIS — G4700 Insomnia, unspecified: Secondary | ICD-10-CM | POA: Insufficient documentation

## 2017-10-31 DIAGNOSIS — Z8679 Personal history of other diseases of the circulatory system: Secondary | ICD-10-CM

## 2017-10-31 DIAGNOSIS — R110 Nausea: Secondary | ICD-10-CM

## 2017-10-31 DIAGNOSIS — O26899 Other specified pregnancy related conditions, unspecified trimester: Principal | ICD-10-CM

## 2017-10-31 DIAGNOSIS — O99332 Smoking (tobacco) complicating pregnancy, second trimester: Secondary | ICD-10-CM | POA: Insufficient documentation

## 2017-10-31 MED ORDER — METOCLOPRAMIDE HCL 10 MG PO TABS: 10.0000 mg | ORAL_TABLET | Freq: Three times a day (TID) | ORAL | 1 refills | Status: DC

## 2017-10-31 NOTE — Patient Instructions (Signed)

## 2017-10-31 NOTE — Progress Notes (Signed)
   PRENATAL VISIT NOTE  Subjective:  Pamela Duncan is a 37 y.o. [id number] at 143w4d being seen today for ongoing prenatal care.  She is currently monitored for the following issues for this low-risk pregnancy and has Supervision of other normal pregnancy, antepartum; Rh negative state in antepartum period; Anxiety disorder affecting pregnancy, antepartum; Depression; Overactive bladder; Hypertension; Advanced maternal age in multigravida; History of hypertension; Tobacco smoking affecting pregnancy in second trimester; and Insomnia on their problem list.  Patient reports nausea, insomnia.   Contractions: Not present. Vag. Bleeding: None.  Movement: Present. Denies leaking of fluid.   The following portions of the patient's history were reviewed and updated as appropriate: allergies, current medications, past family history, past medical history, past social history, past surgical history and problem list. Problem list updated.  Objective:   Vitals:   10/31/17 1014 10/31/17 1021  BP: (!) 113/49 114/63  Pulse: 76 75  Weight: 200 lb 9.6 oz (91 kg)     Fetal Status: Fetal Heart Rate (bpm): 152 Fundal Height: 25 cm Movement: Present     General:  Alert, oriented and cooperative. Patient is in no acute distress.  Skin: Skin is warm and dry. No rash noted.   Cardiovascular: Normal heart rate noted  Respiratory: Normal respiratory effort, no problems with respiration noted  Abdomen: Soft, gravid, appropriate for gestational age.  Pain/Pressure: Absent     Pelvic: Cervical exam deferred        Extremities: Normal range of motion.  Edema: None  Mental Status:  Normal mood and affect. Normal behavior. Normal judgment and thought content.   Assessment and Plan:  Pregnancy: G5P1031 at 5443w4d  1. Supervision of other normal pregnancy, antepartum  Doing well today.   2. Rh negative state in antepartum period  Rhogam at next visit   3. History of hypertension  BP good today.  Continue ASA    4. Tobacco smoking affecting pregnancy in second trimester  Cessation discussed   5. Primary insomnia  Increase vistaril to 50 mg at night. If no better can reevaluated at next visit for another remedy . Decrease night time stimulation. No caffeine after 5:00 pm   6. Nausea   Stop Zofran Start Reglan TID 30 minutes before meals   7. Constipation in pregnancy in second trimester  Discussed "poop clean out". Miralax with Gatorade; instructions given.   Preterm labor symptoms and general obstetric precautions including but not limited to vaginal bleeding, contractions, leaking of fluid and fetal movement were reviewed in detail with the patient. Please refer to After Visit Summary for other counseling recommendations.  Return in about 4 weeks (around 11/28/2017) for Follow up with Me in 4 weeks if possible per the patient .   Venia CarbonJennifer Jakarri Lesko, NP

## 2017-11-08 ENCOUNTER — Ambulatory Visit (HOSPITAL_COMMUNITY)
Admission: RE | Admit: 2017-11-08 | Discharge: 2017-11-08 | Disposition: A | Discharge status: Home / Self Care | Payer: Non-veteran care | Source: Ambulatory Visit | Attending: Obstetrics and Gynecology | Admitting: Obstetrics and Gynecology

## 2017-11-08 ENCOUNTER — Other Ambulatory Visit (HOSPITAL_COMMUNITY): Payer: Self-pay | Admitting: *Deleted

## 2017-11-08 ENCOUNTER — Other Ambulatory Visit (HOSPITAL_COMMUNITY): Payer: Self-pay | Admitting: Maternal and Fetal Medicine

## 2017-11-08 ENCOUNTER — Encounter (HOSPITAL_COMMUNITY): Payer: Self-pay

## 2017-11-08 DIAGNOSIS — O10012 Pre-existing essential hypertension complicating pregnancy, second trimester: Secondary | ICD-10-CM

## 2017-11-08 DIAGNOSIS — Z3A25 25 weeks gestation of pregnancy: Secondary | ICD-10-CM | POA: Diagnosis not present

## 2017-11-08 DIAGNOSIS — O10919 Unspecified pre-existing hypertension complicating pregnancy, unspecified trimester: Secondary | ICD-10-CM

## 2017-11-08 DIAGNOSIS — O09529 Supervision of elderly multigravida, unspecified trimester: Secondary | ICD-10-CM

## 2017-11-08 DIAGNOSIS — O09522 Supervision of elderly multigravida, second trimester: Secondary | ICD-10-CM | POA: Insufficient documentation

## 2017-11-08 NOTE — Addendum Note (Signed)
Encounter addended by: Levonne HubertStalter, Yarelin Reichardt M, RDMS, RVT on: 11/08/2017 2:18 PM  Actions taken: Imaging Exam ended

## 2017-12-01 ENCOUNTER — Encounter: Payer: Medicare HMO | Admitting: Obstetrics and Gynecology

## 2017-12-04 ENCOUNTER — Ambulatory Visit (INDEPENDENT_AMBULATORY_CARE_PROVIDER_SITE_OTHER): Payer: Medicare HMO | Admitting: Advanced Practice Midwife

## 2017-12-04 ENCOUNTER — Encounter: Payer: Self-pay | Admitting: General Practice

## 2017-12-04 ENCOUNTER — Encounter: Payer: Self-pay | Admitting: Advanced Practice Midwife

## 2017-12-04 VITALS — BP 126/60 | HR 87 | Wt 207.0 lb

## 2017-12-04 DIAGNOSIS — Z3483 Encounter for supervision of other normal pregnancy, third trimester: Secondary | ICD-10-CM

## 2017-12-04 DIAGNOSIS — O26893 Other specified pregnancy related conditions, third trimester: Secondary | ICD-10-CM

## 2017-12-04 DIAGNOSIS — Z23 Encounter for immunization: Secondary | ICD-10-CM | POA: Diagnosis not present

## 2017-12-04 DIAGNOSIS — Z3480 Encounter for supervision of other normal pregnancy, unspecified trimester: Secondary | ICD-10-CM

## 2017-12-04 DIAGNOSIS — Z6791 Unspecified blood type, Rh negative: Secondary | ICD-10-CM | POA: Diagnosis not present

## 2017-12-04 DIAGNOSIS — I10 Essential (primary) hypertension: Secondary | ICD-10-CM

## 2017-12-04 MED ORDER — RHO D IMMUNE GLOBULIN 1500 UNIT/2ML IJ SOSY
300.0000 ug | PREFILLED_SYRINGE | Freq: Once | INTRAMUSCULAR | Status: AC
  Administered 2017-12-04: 300 ug via INTRAMUSCULAR

## 2017-12-04 NOTE — Patient Instructions (Signed)
AREA PEDIATRIC/FAMILY PRACTICE PHYSICIANS   [facility] [address], [address] [place], [place]  [address] Phone - [phone]   Fax - [phone]  [facility] [address] [place], [place] [address] Phone - [phone]   Fax - [phone]  [facility] AM[facility] [address] [place], [place]  [address] Phone - [phone]   Fax - [phone]  [facility] [address] [place], [place]  [address] Phone - [phone]   Fax - [phone]  [facility] [address] [place], [place]  [address] Phone - [phone]   Fax - [phone]  [facility] [address] [place], [place]  [address] Phone - [phone]   Fax - [phone]  [facility] [address] [place], [place]  [address] Phone - [phone]   Fax - [phone]  [facility] [address] [place], [place]  [address] Phone - [phone]   Fax - [phone]  [facility] [address] [place], [place]  [address] Phone - [phone]   Fax - [phone] [facility] [address] [place], [place]  [address] Phone - [phone]   Fax - [phone]  [facility] [address] [place], [place]  [address] Phone - [phone]   Fax - [phone]  [facility] [address] [place], [place]  [address] Phone - [phone]   Fax - [phone]  [facility] [address] [place], [place]  [address] Phone - [phone]   Fax - [phone]  SHILPA GOSRANI [address] [place], [place]  [address] Phone - [phone]  [facility] [address] [place], [place]  [address] Phone - [phone]   Fax - [phone]  [facility] [address] [place], [place]  [address] Phone - [phone]   Fax - [phone]  [facility] [address] [place], [place]  [address] Phone - [phone]   Fax - [phone]  [facility] [address] [place], [place]  [address] Phone - [phone]   Fax - [phone]   [facility] [address] [place], [place]  [address] Phone - [phone]   Fax - [phone]  [facility] [address] [place], [place]  [address] Phone - [phone]   Fax - [phone]  DAVID RUBIN [address] [place], [place]  [address] Phone - [phone]   Fax - [phone]  [facility] [address] [place], [place]  [address] Phone - [phone]   Fax - [phone]  [facility] [address] , [place]  [address] Phone - [phone]   Fax - [phone] [facility] [address] [place], [place]  [address] Phone - [phone]   Fax - [phone]  [facility] [address] [place], [place]  [address] Phone - [phone]   Fax - [phone]  [facility] [address] [place], [place]  [address] Phone - [phone]   Fax - [phone]  [facility] Charlene Flemming MD [address] [place]  [address] Phone [phone]  Fax [phone]  Childbirth Education Options: [facility] Classes:  Childbirth education classes can help you   get ready for a positive parenting experience. You can also meet other expectant parents and get free stuff for your baby. Each class runs for five weeks on the same night and costs $45 for the mother-to-be and her support person. Medicaid covers the cost if you are eligible. Call [phone] to register. [facility] Childbirth Education:  [phone] or [phone] or  [email or web address]  Baby & Me Class: Discuss newborn & infant parenting and family adjustment issues with other new mothers in a relaxed environment. Each week brings a new speaker or baby-centered activity. We encourage new mothers to join us every Thursday at 11:00am. Babies birth until crawling. No registration or fee. Daddy Boot Camp: This course offers Dads-to-be the tools and knowledge needed to feel confident on their journey to becoming new fathers. Experienced dads, who have been trained as coaches, teach dads-to-be how to hold, comfort, diaper, swaddle and play with their infant while being able to support the new mom as well. A class for men taught by men. $25/dad Big Brother/Big Sister: Let your children share in the joy of a new brother or sister in this special class designed just for them. Class includes discussion about how families care for babies: swaddling, holding, diapering, safety as well as how they can be helpful in their new role. This class is designed for children ages 2 to 6, but any age is welcome. Please register each child individually. $5/child  Mom Talk: This mom-led group offers support and connection to mothers as they journey through the adjustments and struggles of that sometimes overwhelming first year after the birth of a child. Tuesdays at 10:00am and Thursdays at 6:00pm. Babies welcome. No registration or fee. Breastfeeding Support Group: This group is a mother-to-mother support circle where moms have the opportunity to share their breastfeeding experiences. A Lactation Consultant is present for questions and concerns. Meets each Tuesday at 11:00am. No fee or registration. Breastfeeding Your Baby: Learn what to expect in the first days of breastfeeding your newborn.  This class will help you feel more confident with the skills needed to begin your breastfeeding experience. Many new mothers are concerned about breastfeeding after leaving the hospital. This class  will also address the most common fears and challenges about breastfeeding during the first few weeks, months and beyond. (call for fee) Comfort Techniques and Tour: This 2 hour interactive class will provide you the opportunity to learn & practice hands-on techniques that can help relieve some of the discomfort of labor and encourage your baby to rotate toward the best position for birth. You and your partner will be able to try a variety of labor positions with birth balls and rebozos as well as practice breathing, relaxation, and visualization techniques. A tour of the [facility] is included with this class. $20 per registrant and support person Childbirth Class- Weekend Option: This class is a Weekend version of our Birth & Baby series. It is designed for parents who have a difficult time fitting several weeks of classes into their schedule. It covers the care of your newborn and the basics of labor and childbirth. It also includes a Maternity Care Center Tour of [facility] and lunch. The class is held two consecutive days: beginning on Friday evening from 6:30 - 8:30 p.m. and the next day, Saturday from 9 a.m. - 4 p.m. (call for fee) Waterbirth Class: Interested in a waterbirth?  This informational class will help you discover whether waterbirth is the right fit for you.   Education about waterbirth itself, supplies you would need and how to assemble your support team is what you can expect from this class. Some obstetrical practices require this class in order to pursue a waterbirth. (Not all obstetrical practices offer waterbirth-check with your healthcare provider.) Register only the expectant mom, but you are encouraged to bring your partner to class! Required if planning waterbirth, no fee. Infant/Child CPR: Parents, grandparents, babysitters, and friends learn Cardio-Pulmonary Resuscitation skills for infants and children. You will also learn how to treat both conscious  and unconscious choking in infants and children. This Family & Friends program does not offer certification. Register each participant individually to ensure that enough mannequins are available. (Call for fee) Grandparent Love: Expecting a grandbaby? This class is for you! Learn about the latest infant care and safety recommendations and ways to support your own child as he or she transitions into the parenting role. Taught by Registered Nurses who are childbirth instructors, but most importantly...they are grandmothers too! $10/person. Childbirth Class- Natural Childbirth: This series of 5 weekly classes is for expectant parents who want to learn and practice natural methods of coping with the process of labor and childbirth. Relaxation, breathing, massage, visualization, role of the partner, and helpful positioning are highlighted. Participants learn how to be confident in their body's ability to give birth. This class will empower and help parents make informed decisions about their own care. Includes discussion that will help new parents transition into the immediate postpartum period. [facility] is included. We suggest taking this class between 25-32 weeks, but it's only a recommendation. $75 per registrant and one support person or $30 Medicaid. Childbirth Class- 3 week Series: This option of 3 weekly classes helps you and your labor partner prepare for childbirth. Newborn care, labor & birth, cesarean birth, pain management, and comfort techniques are discussed and a [place] [facility] is included. The class meets at the same time, on the same day of the week for 3 consecutive weeks beginning with the starting date you choose. $60 for registrant and one support person.  Marvelous Multiples: Expecting twins, triplets, or more? This class covers the differences in labor, birth, parenting, and breastfeeding issues that face multiples' parents.  NICU tour is included. Led by a Certified Childbirth Educator who is the mother of twins. No fee. Caring for Baby: This class is for expectant and adoptive parents who want to learn and practice the most up-to-date newborn care for their babies. Focus is on birth through the first six weeks of life. Topics include feeding, bathing, diapering, crying, umbilical cord care, circumcision care and safe sleep. Parents learn to recognize symptoms of illness and when to call the pediatrician. Register only the mom-to-be and your partner or support person can plan to come with you! $10 per registrant and support person Childbirth Class- online option: This online class offers you the freedom to complete a Birth and Baby series in the comfort of your own home. The flexibility of this option allows you to review sections at your own pace, at times convenient to you and your support people. It includes additional video information, animations, quizzes, and extended activities. Get organized with helpful eClass tools, checklists, and trackers. Once you register online for the class, you will receive an email within a few days to accept the invitation and begin the class when the time is right for you. The content will be available to you for 60 days. $  60 for 60 days of online access for you and your support people.  Local Doulas: Natural Baby Doulas [email or web address] Tel: [phone] [email or web address] [phone] [email or web address] The Labor Merla RichesLadies  (also do waterbirth tub rental) [phone] [email or web address] [email or web address] [phone] [email or web address] [facility]  [phone] [email or web address] [employer] (PADA) [email or web address] [place] [facility]   [email or web address]

## 2017-12-04 NOTE — Progress Notes (Signed)
   PRENATAL VISIT NOTE  Subjective:  Pamela Duncan is a 37 y.o. [id number] at 5886w3d being seen today for ongoing prenatal care.  She is currently monitored for the following issues for this high-risk pregnancy and has Supervision of other normal pregnancy, antepartum; Rh negative state in antepartum period; Anxiety disorder affecting pregnancy, antepartum; Depression; Overactive bladder; Hypertension; Advanced maternal age in multigravida; History of hypertension; Tobacco smoking affecting pregnancy in second trimester; Insomnia; Nausea; and Constipation in pregnancy in second trimester on their problem list.  Patient reports no complaints.  Contractions: Not present. Vag. Bleeding: None.  Movement: Present. Denies leaking of fluid.   The following portions of the patient's history were reviewed and updated as appropriate: allergies, current medications, past family history, past medical history, past social history, past surgical history and problem list. Problem list updated.  Objective:   Vitals:   12/04/17 1010  BP: 126/60  Pulse: 87  Weight: 207 lb (93.9 kg)    Fetal Status:   Fundal Height: 29 cm Movement: Present     General:  Alert, oriented and cooperative. Patient is in no acute distress.  Skin: Skin is warm and dry. No rash noted.   Cardiovascular: Normal heart rate noted  Respiratory: Normal respiratory effort, no problems with respiration noted  Abdomen: Soft, gravid, appropriate for gestational age.  Pain/Pressure: Absent     Pelvic: Cervical exam deferred        Extremities: Normal range of motion.  Edema: None  Mental Status:  Normal mood and affect. Normal behavior. Normal judgment and thought content.   Assessment and Plan:  Pregnancy: G5P1031 at 2886w3d  1. Supervision of other normal pregnancy, antepartum - Unable to do GTT today, patient not fasting  - Rhogam/Tdap  - Routine care   2. Chronic hypertensions, no meds - Needs to start antenatal testing at [place], 32  weeks.   Preterm labor symptoms and general obstetric precautions including but not limited to vaginal bleeding, contractions, leaking of fluid and fetal movement were reviewed in detail with the patient. Please refer to After Visit Summary for other counseling recommendations.  Return in about 2 weeks (around 12/18/2017).   Thressa ShellerHeather Hogan, CNM       Guidelines for Antenatal Testing and Sonography  (with updated ICD-10 codes) INDICATION U/S NST/AFI DELIVERY       CHTN - O10.919  Group I   BP < 140/90, no preeclampsia, AGA,  nml AFV, +/- meds    Group II   BP > 140/90, on meds, no preeclampsia, AGA, nml AFV  20-28-34-38  20-24-28-32-35-38  32//2 x wk  28//BPP wkly then 32//2 x wk or BPP wkly  40 no meds; 39 meds  PRN or 37  Pre-eclampsia  GHTN - O13.9/Preeclampsia without severe features  - O14.00   Preeclampsia with severe features - O14.10  Q  3-4wks  Q 2 wks  28//BPP wkly then 32//2 x wk or BPP wkly  Inpatient  37  PRN or 34

## 2017-12-05 LAB — HIV ANTIBODY (ROUTINE TESTING W REFLEX): HIV SCREEN 4TH GENERATION: NONREACTIVE

## 2017-12-05 LAB — RPR: RPR Ser Ql: NONREACTIVE

## 2017-12-05 LAB — CBC
Hematocrit: 33.7 % — ABNORMAL LOW (ref 34.0–46.6)
Hemoglobin: 11.3 g/dL (ref 11.1–15.9)
MCH: 30.6 pg (ref 26.6–33.0)
MCHC: 33.5 g/dL (ref 31.5–35.7)
MCV: 91 fL (ref 79–97)
PLATELETS: 202 10*3/uL (ref 150–379)
RBC: 3.69 x10E6/uL — AB (ref 3.77–5.28)
RDW: 13.2 % (ref 12.3–15.4)
WBC: 13.9 10*3/uL — AB (ref 3.4–10.8)

## 2017-12-06 ENCOUNTER — Encounter (HOSPITAL_COMMUNITY): Payer: Self-pay

## 2017-12-06 ENCOUNTER — Ambulatory Visit (HOSPITAL_COMMUNITY)
Admission: RE | Admit: 2017-12-06 | Discharge: 2017-12-06 | Disposition: A | Discharge status: Home / Self Care | Payer: Non-veteran care | Source: Ambulatory Visit | Attending: Obstetrics and Gynecology | Admitting: Obstetrics and Gynecology

## 2017-12-06 DIAGNOSIS — O10013 Pre-existing essential hypertension complicating pregnancy, third trimester: Secondary | ICD-10-CM | POA: Diagnosis present

## 2017-12-06 DIAGNOSIS — O09523 Supervision of elderly multigravida, third trimester: Secondary | ICD-10-CM | POA: Diagnosis present

## 2017-12-06 DIAGNOSIS — Z362 Encounter for other antenatal screening follow-up: Secondary | ICD-10-CM | POA: Diagnosis present

## 2017-12-06 DIAGNOSIS — Z3A29 29 weeks gestation of pregnancy: Secondary | ICD-10-CM | POA: Insufficient documentation

## 2017-12-06 DIAGNOSIS — O09893 Supervision of other high risk pregnancies, third trimester: Secondary | ICD-10-CM | POA: Diagnosis not present

## 2017-12-06 DIAGNOSIS — O321XX0 Maternal care for breech presentation, not applicable or unspecified: Secondary | ICD-10-CM | POA: Diagnosis not present

## 2017-12-06 DIAGNOSIS — O10919 Unspecified pre-existing hypertension complicating pregnancy, unspecified trimester: Secondary | ICD-10-CM

## 2017-12-07 ENCOUNTER — Other Ambulatory Visit (HOSPITAL_COMMUNITY): Payer: Self-pay | Admitting: *Deleted

## 2017-12-07 DIAGNOSIS — O10919 Unspecified pre-existing hypertension complicating pregnancy, unspecified trimester: Secondary | ICD-10-CM

## 2017-12-12 ENCOUNTER — Other Ambulatory Visit: Payer: Medicare HMO

## 2017-12-14 ENCOUNTER — Other Ambulatory Visit: Payer: Self-pay

## 2017-12-14 DIAGNOSIS — Z3480 Encounter for supervision of other normal pregnancy, unspecified trimester: Secondary | ICD-10-CM

## 2017-12-15 ENCOUNTER — Other Ambulatory Visit: Payer: Medicare HMO

## 2017-12-18 ENCOUNTER — Ambulatory Visit (INDEPENDENT_AMBULATORY_CARE_PROVIDER_SITE_OTHER): Payer: Medicare HMO | Admitting: Family Medicine

## 2017-12-18 ENCOUNTER — Other Ambulatory Visit: Payer: Medicare HMO

## 2017-12-18 ENCOUNTER — Encounter: Payer: Self-pay | Admitting: General Practice

## 2017-12-18 ENCOUNTER — Ambulatory Visit: Payer: Medicare HMO | Admitting: *Deleted

## 2017-12-18 VITALS — BP 125/70 | HR 80 | Wt 209.5 lb

## 2017-12-18 DIAGNOSIS — O10019 Pre-existing essential hypertension complicating pregnancy, unspecified trimester: Secondary | ICD-10-CM

## 2017-12-18 DIAGNOSIS — O09899 Supervision of other high risk pregnancies, unspecified trimester: Secondary | ICD-10-CM

## 2017-12-18 DIAGNOSIS — Z6791 Unspecified blood type, Rh negative: Secondary | ICD-10-CM

## 2017-12-18 DIAGNOSIS — I10 Essential (primary) hypertension: Secondary | ICD-10-CM

## 2017-12-18 DIAGNOSIS — O09523 Supervision of elderly multigravida, third trimester: Secondary | ICD-10-CM

## 2017-12-18 DIAGNOSIS — K0889 Other specified disorders of teeth and supporting structures: Secondary | ICD-10-CM

## 2017-12-18 DIAGNOSIS — O26899 Other specified pregnancy related conditions, unspecified trimester: Secondary | ICD-10-CM

## 2017-12-18 DIAGNOSIS — Z3480 Encounter for supervision of other normal pregnancy, unspecified trimester: Secondary | ICD-10-CM

## 2017-12-18 LAB — GLUCOSE, CAPILLARY: Glucose-Capillary: 157 mg/dL — ABNORMAL HIGH (ref 65–99)

## 2017-12-18 MED ORDER — HYDROCODONE-ACETAMINOPHEN 5-325 MG PO TABS: 1.0000 | ORAL_TABLET | Freq: Four times a day (QID) | ORAL | 0 refills | Status: DC | PRN

## 2017-12-18 MED ORDER — AMOXICILLIN 500 MG PO CAPS: 500.0000 mg | ORAL_CAPSULE | Freq: Three times a day (TID) | ORAL | 0 refills | Status: DC

## 2017-12-18 NOTE — Patient Instructions (Signed)
Third Trimester of Pregnancy The third trimester is from week 28 through week 40 (months 7 through 9). The third trimester is a time when the unborn baby (fetus) is growing rapidly. At the end of the ninth month, the fetus is about 20 inches in length and weighs 6-10 pounds. Body changes during your third trimester Your body will continue to go through many changes during pregnancy. The changes vary from woman to woman. During the third trimester:  Your weight will continue to increase. You can expect to gain 25-35 pounds (11-16 kg) by the end of the pregnancy.  You may begin to get stretch marks on your hips, abdomen, and breasts.  You may urinate more often because the fetus is moving lower into your pelvis and pressing on your bladder.  You may develop or continue to have heartburn. This is caused by increased hormones that slow down muscles in the digestive tract.  You may develop or continue to have constipation because increased hormones slow digestion and cause the muscles that push waste through your intestines to relax.  You may develop hemorrhoids. These are swollen veins (varicose veins) in the rectum that can itch or be painful.  You may develop swollen, bulging veins (varicose veins) in your legs.  You may have increased body aches in the pelvis, back, or thighs. This is due to weight gain and increased hormones that are relaxing your joints.  You may have changes in your hair. These can include thickening of your hair, rapid growth, and changes in texture. Some women also have hair loss during or after pregnancy, or hair that feels dry or thin. Your hair will most likely return to normal after your baby is born.  Your breasts will continue to grow and they will continue to become tender. A yellow fluid (colostrum) may leak from your breasts. This is the first milk you are producing for your baby.  Your belly button may stick out.  You may notice more swelling in your hands,  face, or ankles.  You may have increased tingling or numbness in your hands, arms, and legs. The skin on your belly may also feel numb.  You may feel short of breath because of your expanding uterus.  You may have more problems sleeping. This can be caused by the size of your belly, increased need to urinate, and an increase in your body's metabolism.  You may notice the fetus "dropping," or moving lower in your abdomen (lightening).  You may have increased vaginal discharge.  You may notice your joints feel loose and you may have pain around your pelvic bone.  What to expect at prenatal visits You will have prenatal exams every 2 weeks until week 36. Then you will have weekly prenatal exams. During a routine prenatal visit:  You will be weighed to make sure you and the baby are growing normally.  Your blood pressure will be taken.  Your abdomen will be measured to track your baby's growth.  The fetal heartbeat will be listened to.  Any test results from the previous visit will be discussed.  You may have a cervical check near your due date to see if your cervix has softened or thinned (effaced).  You will be tested for Group B streptococcus. This happens between 35 and 37 weeks.  Your health care provider may ask you:  What your birth plan is.  How you are feeling.  If you are feeling the baby move.  If you have had   any abnormal symptoms, such as leaking fluid, bleeding, severe headaches, or abdominal cramping.  If you are using any tobacco products, including cigarettes, chewing tobacco, and electronic cigarettes.  If you have any questions.  Other tests or screenings that may be performed during your third trimester include:  Blood tests that check for low iron levels (anemia).  Fetal testing to check the health, activity level, and growth of the fetus. Testing is done if you have certain medical conditions or if there are problems during the  pregnancy.  Nonstress test (NST). This test checks the health of your baby to make sure there are no signs of problems, such as the baby not getting enough oxygen. During this test, a belt is placed around your belly. The baby is made to move, and its heart rate is monitored during movement.  What is false labor? False labor is a condition in which you feel small, irregular tightenings of the muscles in the womb (contractions) that usually go away with rest, changing position, or drinking water. These are called Braxton Hicks contractions. Contractions may last for hours, days, or even weeks before true labor sets in. If contractions come at regular intervals, become more frequent, increase in intensity, or become painful, you should see your health care provider. What are the signs of labor?  Abdominal cramps.  Regular contractions that start at 10 minutes apart and become stronger and more frequent with time.  Contractions that start on the top of the uterus and spread down to the lower abdomen and back.  Increased pelvic pressure and dull back pain.  A watery or bloody mucus discharge that comes from the vagina.  Leaking of amniotic fluid. This is also known as your "water breaking." It could be a slow trickle or a gush. Let your health care provider know if it has a color or strange odor. If you have any of these signs, call your health care provider right away, even if it is before your due date. Follow these instructions at home: Medicines  Follow your health care provider's instructions regarding medicine use. Specific medicines may be either safe or unsafe to take during pregnancy.  Take a prenatal vitamin that contains at least 600 micrograms (mcg) of folic acid.  If you develop constipation, try taking a stool softener if your health care provider approves. Eating and drinking  Eat a balanced diet that includes fresh fruits and vegetables, whole grains, good sources of protein  such as meat, eggs, or tofu, and low-fat dairy. Your health care provider will help you determine the amount of weight gain that is right for you.  Avoid raw meat and uncooked cheese. These carry germs that can cause birth defects in the baby.  If you have low calcium intake from food, talk to your health care provider about whether you should take a daily calcium supplement.  Eat four or five small meals rather than three large meals a day.  Limit foods that are high in fat and processed sugars, such as fried and sweet foods.  To prevent constipation: ? Drink enough fluid to keep your urine clear or pale yellow. ? Eat foods that are high in fiber, such as fresh fruits and vegetables, whole grains, and beans. Activity  Exercise only as directed by your health care provider. Most women can continue their usual exercise routine during pregnancy. Try to exercise for 30 minutes at least 5 days a week. Stop exercising if you experience uterine contractions.  Avoid heavy   lifting.  Do not exercise in extreme heat or humidity, or at high altitudes.  Wear low-heel, comfortable shoes.  Practice good posture.  You may continue to have sex unless your health care provider tells you otherwise. Relieving pain and discomfort  Take frequent breaks and rest with your legs elevated if you have leg cramps or low back pain.  Take warm sitz baths to soothe any pain or discomfort caused by hemorrhoids. Use hemorrhoid cream if your health care provider approves.  Wear a good support bra to prevent discomfort from breast tenderness.  If you develop varicose veins: ? Wear support pantyhose or compression stockings as told by your healthcare provider. ? Elevate your feet for 15 minutes, 3-4 times a day. Prenatal care  Write down your questions. Take them to your prenatal visits.  Keep all your prenatal visits as told by your health care provider. This is important. Safety  Wear your seat belt at  all times when driving.  Make a list of emergency phone numbers, including numbers for family, friends, the hospital, and police and fire departments. General instructions  Avoid cat litter boxes and soil used by cats. These carry germs that can cause birth defects in the baby. If you have a cat, ask someone to clean the litter box for you.  Do not travel far distances unless it is absolutely necessary and only with the approval of your health care provider.  Do not use hot tubs, steam rooms, or saunas.  Do not drink alcohol.  Do not use any products that contain nicotine or tobacco, such as cigarettes and e-cigarettes. If you need help quitting, ask your health care provider.  Do not use any medicinal herbs or unprescribed drugs. These chemicals affect the formation and growth of the baby.  Do not douche or use tampons or scented sanitary pads.  Do not cross your legs for long periods of time.  To prepare for the arrival of your baby: ? Take prenatal classes to understand, practice, and ask questions about labor and delivery. ? Make a trial run to the hospital. ? Visit the hospital and tour the maternity area. ? Arrange for maternity or paternity leave through employers. ? Arrange for family and friends to take care of pets while you are in the hospital. ? Purchase a rear-facing car seat and make sure you know how to install it in your car. ? Pack your hospital bag. ? Prepare the baby's nursery. Make sure to remove all pillows and stuffed animals from the baby's crib to prevent suffocation.  Visit your dentist if you have not gone during your pregnancy. Use a soft toothbrush to brush your teeth and be gentle when you floss. Contact a health care provider if:  You are unsure if you are in labor or if your water has broken.  You become dizzy.  You have mild pelvic cramps, pelvic pressure, or nagging pain in your abdominal area.  You have lower back pain.  You have persistent  nausea, vomiting, or diarrhea.  You have an unusual or bad smelling vaginal discharge.  You have pain when you urinate. Get help right away if:  Your water breaks before 37 weeks.  You have regular contractions less than 5 minutes apart before 37 weeks.  You have a fever.  You are leaking fluid from your vagina.  You have spotting or bleeding from your vagina.  You have severe abdominal pain or cramping.  You have rapid weight loss or weight gain.    You have shortness of breath with chest pain.  You notice sudden or extreme swelling of your face, hands, ankles, feet, or legs.  Your baby makes fewer than 10 movements in 2 hours.  You have severe headaches that do not go away when you take medicine.  You have vision changes. Summary  The third trimester is from week 28 through week 40, months 7 through 9. The third trimester is a time when the unborn baby (fetus) is growing rapidly.  During the third trimester, your discomfort may increase as you and your baby continue to gain weight. You may have abdominal, leg, and back pain, sleeping problems, and an increased need to urinate.  During the third trimester your breasts will keep growing and they will continue to become tender. A yellow fluid (colostrum) may leak from your breasts. This is the first milk you are producing for your baby.  False labor is a condition in which you feel small, irregular tightenings of the muscles in the womb (contractions) that eventually go away. These are called Braxton Hicks contractions. Contractions may last for hours, days, or even weeks before true labor sets in.  Signs of labor can include: abdominal cramps; regular contractions that start at 10 minutes apart and become stronger and more frequent with time; watery or bloody mucus discharge that comes from the vagina; increased pelvic pressure and dull back pain; and leaking of amniotic fluid. This information is not intended to replace advice  given to you by your health care provider. Make sure you discuss any questions you have with your health care provider. Document Released: 10/04/2001 Document Revised: 03/17/2016 Document Reviewed: 12/11/2012 [employer] Interactive Patient Education  2017 [employer]  Breastfeeding Choosing to breastfeed is one of the best decisions you can make for yourself and your baby. A change in hormones during pregnancy causes your breasts to make breast milk in your milk-producing glands. Hormones prevent breast milk from being released before your baby is born. They also prompt milk flow after birth. Once breastfeeding has begun, thoughts of your baby, as well as his or her sucking or crying, can stimulate the release of milk from your milk-producing glands. Benefits of breastfeeding Research shows that breastfeeding offers many health benefits for infants and mothers. It also offers a cost-free and convenient way to feed your baby. For your baby  Your first milk (colostrum) helps your baby's digestive system to function better.  Special cells in your milk (antibodies) help your baby to fight off infections.  Breastfed babies are less likely to develop asthma, allergies, obesity, or type 2 diabetes. They are also at lower risk for sudden infant death syndrome (SIDS).  Nutrients in breast milk are better able to meet your baby's needs compared to infant formula.  Breast milk improves your baby's brain development. For you  Breastfeeding helps to create a very special bond between you and your baby.  Breastfeeding is convenient. Breast milk costs nothing and is always available at the correct temperature.  Breastfeeding helps to burn calories. It helps you to lose the weight that you gained during pregnancy.  Breastfeeding makes your uterus return faster to its size before pregnancy. It also slows bleeding (lochia) after you give birth.  Breastfeeding helps to lower your risk of developing type 2  diabetes, osteoporosis, rheumatoid arthritis, cardiovascular disease, and breast, ovarian, uterine, and endometrial cancer later in life. Breastfeeding basics Starting breastfeeding  Find a comfortable place to sit or lie down, with your neck and back   well-supported.  Place a pillow or a rolled-up blanket under your baby to bring him or her to the level of your breast (if you are seated). Nursing pillows are specially designed to help support your arms and your baby while you breastfeed.  Make sure that your baby's tummy (abdomen) is facing your abdomen.  Gently massage your breast. With your fingertips, massage from the outer edges of your breast inward toward the nipple. This encourages milk flow. If your milk flows slowly, you may need to continue this action during the feeding.  Support your breast with 4 fingers underneath and your thumb above your nipple (make the letter "C" with your hand). Make sure your fingers are well away from your nipple and your baby's mouth.  Stroke your baby's lips gently with your finger or nipple.  When your baby's mouth is open wide enough, quickly bring your baby to your breast, placing your entire nipple and as much of the areola as possible into your baby's mouth. The areola is the colored area around your nipple. ? More areola should be visible above your baby's upper lip than below the lower lip. ? Your baby's lips should be opened and extended outward (flanged) to ensure an adequate, comfortable latch. ? Your baby's tongue should be between his or her lower gum and your breast.  Make sure that your baby's mouth is correctly positioned around your nipple (latched). Your baby's lips should create a seal on your breast and be turned out (everted).  It is common for your baby to suck about 2-3 minutes in order to start the flow of breast milk. Latching Teaching your baby how to latch onto your breast properly is very important. An improper latch can  cause nipple pain, decreased milk supply, and poor weight gain in your baby. Also, if your baby is not latched onto your nipple properly, he or she may swallow some air during feeding. This can make your baby fussy. Burping your baby when you switch breasts during the feeding can help to get rid of the air. However, teaching your baby to latch on properly is still the best way to prevent fussiness from swallowing air while breastfeeding. Signs that your baby has successfully latched onto your nipple  Silent tugging or silent sucking, without causing you pain. Infant's lips should be extended outward (flanged).  Swallowing heard between every 3-4 sucks once your milk has started to flow (after your let-down milk reflex occurs).  Muscle movement above and in front of his or her ears while sucking.  Signs that your baby has not successfully latched onto your nipple  Sucking sounds or smacking sounds from your baby while breastfeeding.  Nipple pain.  If you think your baby has not latched on correctly, slip your finger into the corner of your baby's mouth to break the suction and place it between your baby's gums. Attempt to start breastfeeding again. Signs of successful breastfeeding Signs from your baby  Your baby will gradually decrease the number of sucks or will completely stop sucking.  Your baby will fall asleep.  Your baby's body will relax.  Your baby will retain a small amount of milk in his or her mouth.  Your baby will let go of your breast by himself or herself.  Signs from you  Breasts that have increased in firmness, weight, and size 1-3 hours after feeding.  Breasts that are softer immediately after breastfeeding.  Increased milk volume, as well as a change in milk   consistency and color by the fifth day of breastfeeding.  Nipples that are not sore, cracked, or bleeding.  Signs that your baby is getting enough milk  Wetting at least 1-2 diapers during the first 24  hours after birth.  Wetting at least 5-6 diapers every 24 hours for the first week after birth. The urine should be clear or pale yellow by the age of 5 days.  Wetting 6-8 diapers every 24 hours as your baby continues to grow and develop.  At least 3 stools in a 24-hour period by the age of 5 days. The stool should be soft and yellow.  At least 3 stools in a 24-hour period by the age of 7 days. The stool should be seedy and yellow.  No loss of weight greater than 10% of birth weight during the first 3 days of life.  Average weight gain of 4-7 oz (113-198 g) per week after the age of 4 days.  Consistent daily weight gain by the age of 5 days, without weight loss after the age of 2 weeks. After a feeding, your baby may spit up a small amount of milk. This is normal. Breastfeeding frequency and duration Frequent feeding will help you make more milk and can prevent sore nipples and extremely full breasts (breast engorgement). Breastfeed when you feel the need to reduce the fullness of your breasts or when your baby shows signs of hunger. This is called "breastfeeding on demand." Signs that your baby is hungry include:  Increased alertness, activity, or restlessness.  Movement of the head from side to side.  Opening of the mouth when the corner of the mouth or cheek is stroked (rooting).  Increased sucking sounds, smacking lips, cooing, sighing, or squeaking.  Hand-to-mouth movements and sucking on fingers or hands.  Fussing or crying.  Avoid introducing a pacifier to your baby in the first 4-6 weeks after your baby is born. After this time, you may choose to use a pacifier. Research has shown that pacifier use during the first year of a baby's life decreases the risk of sudden infant death syndrome (SIDS). Allow your baby to feed on each breast as long as he or she wants. When your baby unlatches or falls asleep while feeding from the first breast, offer the second breast. Because  newborns are often sleepy in the first few weeks of life, you may need to awaken your baby to get him or her to feed. Breastfeeding times will vary from baby to baby. However, the following rules can serve as a guide to help you make sure that your baby is properly fed:  Newborns (babies 4 weeks of age or younger) may breastfeed every 1-3 hours.  Newborns should not go without breastfeeding for longer than 3 hours during the day or 5 hours during the night.  You should breastfeed your baby a minimum of 8 times in a 24-hour period.  Breast milk pumping Pumping and storing breast milk allows you to make sure that your baby is exclusively fed your breast milk, even at times when you are unable to breastfeed. This is especially important if you go back to work while you are still breastfeeding, or if you are not able to be present during feedings. Your lactation consultant can help you find a method of pumping that works best for you and give you guidelines about how long it is safe to store breast milk. Caring for your breasts while you breastfeed Nipples can become dry, cracked, and   sore while breastfeeding. The following recommendations can help keep your breasts moisturized and healthy:  Avoid using soap on your nipples.  Wear a supportive bra designed especially for nursing. Avoid wearing underwire-style bras or extremely tight bras (sports bras).  Air-dry your nipples for 3-4 minutes after each feeding.  Use only cotton bra pads to absorb leaked breast milk. Leaking of breast milk between feedings is normal.  Use lanolin on your nipples after breastfeeding. Lanolin helps to maintain your skin's normal moisture barrier. Pure lanolin is not harmful (not toxic) to your baby. You may also hand express a few drops of breast milk and gently massage that milk into your nipples and allow the milk to air-dry.  In the first few weeks after giving birth, some women experience breast engorgement.  Engorgement can make your breasts feel heavy, warm, and tender to the touch. Engorgement peaks within 3-5 days after you give birth. The following recommendations can help to ease engorgement:  Completely empty your breasts while breastfeeding or pumping. You may want to start by applying warm, moist heat (in the shower or with warm, water-soaked hand towels) just before feeding or pumping. This increases circulation and helps the milk flow. If your baby does not completely empty your breasts while breastfeeding, pump any extra milk after he or she is finished.  Apply ice packs to your breasts immediately after breastfeeding or pumping, unless this is too uncomfortable for you. To do this: ? Put ice in a plastic bag. ? Place a towel between your skin and the bag. ? Leave the ice on for 20 minutes, 2-3 times a day.  Make sure that your baby is latched on and positioned properly while breastfeeding.  If engorgement persists after 48 hours of following these recommendations, contact your health care provider or a lactation consultant. Overall health care recommendations while breastfeeding  Eat 3 healthy meals and 3 snacks every day. Well-nourished mothers who are breastfeeding need an additional 450-500 calories a day. You can meet this requirement by increasing the amount of a balanced diet that you eat.  Drink enough water to keep your urine pale yellow or clear.  Rest often, relax, and continue to take your prenatal vitamins to prevent fatigue, stress, and low vitamin and mineral levels in your body (nutrient deficiencies).  Do not use any products that contain nicotine or tobacco, such as cigarettes and e-cigarettes. Your baby may be harmed by chemicals from cigarettes that pass into breast milk and exposure to secondhand smoke. If you need help quitting, ask your health care provider.  Avoid alcohol.  Do not use illegal drugs or marijuana.  Talk with your health care provider before  taking any medicines. These include over-the-counter and prescription medicines as well as vitamins and herbal supplements. Some medicines that may be harmful to your baby can pass through breast milk.  It is possible to become pregnant while breastfeeding. If birth control is desired, ask your health care provider about options that will be safe while breastfeeding your baby. Where to find more information: [employer]: www.llli.org Contact a health care provider if:  You feel like you want to stop breastfeeding or have become frustrated with breastfeeding.  Your nipples are cracked or bleeding.  Your breasts are red, tender, or warm.  You have: ? Painful breasts or nipples. ? A swollen area on either breast. ? A fever or chills. ? Nausea or vomiting. ? Drainage other than breast milk from your nipples.  Your   breasts do not become full before feedings by the fifth day after you give birth.  You feel sad and depressed.  Your baby is: ? Too sleepy to eat well. ? Having trouble sleeping. ? More than 1 week old and wetting fewer than 6 diapers in a 24-hour period. ? Not gaining weight by 5 days of age.  Your baby has fewer than 3 stools in a 24-hour period.  Your baby's skin or the white parts of his or her eyes become yellow. Get help right away if:  Your baby is overly tired (lethargic) and does not want to wake up and feed.  Your baby develops an unexplained fever. Summary  Breastfeeding offers many health benefits for infant and mothers.  Try to breastfeed your infant when he or she shows early signs of hunger.  Gently tickle or stroke your baby's lips with your finger or nipple to allow the baby to open his or her mouth. Bring the baby to your breast. Make sure that much of the areola is in your baby's mouth. Offer one side and burp the baby before you offer the other side.  Talk with your health care provider or lactation consultant if you have  questions or you face problems as you breastfeed. This information is not intended to replace advice given to you by your health care provider. Make sure you discuss any questions you have with your health care provider. Document Released: 10/10/2005 Document Revised: 11/11/2016 Document Reviewed: 11/11/2016 Elsevier Interactive Patient Education  2018 Elsevier Inc.  

## 2017-12-18 NOTE — Progress Notes (Signed)
   PRENATAL VISIT NOTE  Subjective:  Pamela Duncan is a 37 y.o. [id number] at 2481w3d being seen today for ongoing prenatal care.  She is currently monitored for the following issues for this high-risk pregnancy and has Supervision of other normal pregnancy, antepartum; Rh negative state in antepartum period; Anxiety disorder affecting pregnancy, antepartum; Depression; Overactive bladder; Hypertension in pregnancy, antepartum; Advanced maternal age in multigravida; Benign essential hypertension; Tobacco smoking affecting pregnancy in second trimester; Insomnia; Nausea; and Constipation in pregnancy in second trimester on their problem list.  Patient reports toothache.  Contractions: Not present. Vag. Bleeding: None.  Movement: Present. Denies leaking of fluid.   The following portions of the patient's history were reviewed and updated as appropriate: allergies, current medications, past family history, past medical history, past social history, past surgical history and problem list. Problem list updated.  Objective:   Vitals:   12/18/17 0823  BP: 125/70  Pulse: 80  Weight: 209 lb 8 oz (95 kg)    Fetal Status: Fetal Heart Rate (bpm): 142 Fundal Height: 30 cm Movement: Present     General:  Alert, oriented and cooperative. Patient is in no acute distress.  Skin: Skin is warm and dry. No rash noted.   Cardiovascular: Normal heart rate noted  Respiratory: Normal respiratory effort, no problems with respiration noted  Abdomen: Soft, gravid, appropriate for gestational age.  Pain/Pressure: Present     Pelvic: Cervical exam deferred        Extremities: Normal range of motion.  Edema: None  Mental Status:  Normal mood and affect. Normal behavior. Normal judgment and thought content.   Assessment and Plan:  Pregnancy: G5P1031 at 181w3d  1. Elderly multigravida in third trimester Nml NIPS  2. Rh negative state in antepartum period S/p Rhogam  3. Supervision of other normal pregnancy,  antepartum Attempted 2 hour--held it down x 45 min, then threw up--CBG was 157 then. Took anti-emetic prior to coming in. Will attempt 1 hour tomorrow when not fasting. - Glucose Tolerance, 2 Hours w/1 Hour  4. Pre-existing essential hypertension during pregnancy, antepartum BP is normal  5. Benign essential hypertension BP is well controlled  6. Toothache Call endodontist may need root canal. - amoxicillin (AMOXIL) 500 MG capsule; Take 1 capsule (500 mg total) by mouth 3 (three) times daily.  Dispense: 15 capsule; Refill: 0 - HYDROcodone-acetaminophen (NORCO/VICODIN) 5-325 MG tablet; Take 1 tablet by mouth every 6 (six) hours as needed for moderate pain.  Dispense: 30 tablet; Refill: 0  Preterm labor symptoms and general obstetric precautions including but not limited to vaginal bleeding, contractions, leaking of fluid and fetal movement were reviewed in detail with the patient. Please refer to After Visit Summary for other counseling recommendations.  Return in about 1 week (around 12/25/2017) for NST/BPP and OB as scheduled.   Reva Boresanya S Arica Bevilacqua, MD

## 2017-12-18 NOTE — Progress Notes (Signed)
Pt was scheduled for NST/BPP today however is not yet [redacted] weeks EGA - will begin testing next week per guideline.

## 2017-12-18 NOTE — Progress Notes (Signed)
Pt stated having problem tooth pain and been taking rx antibiotic, ibufro, advil. Pt have appt. This Friday coming.

## 2017-12-25 ENCOUNTER — Encounter: Payer: Medicare HMO | Admitting: Obstetrics & Gynecology

## 2017-12-25 ENCOUNTER — Other Ambulatory Visit: Payer: Medicare HMO

## 2018-01-01 ENCOUNTER — Encounter: Payer: Medicare HMO | Admitting: Family Medicine

## 2018-01-01 ENCOUNTER — Other Ambulatory Visit: Payer: Medicare HMO

## 2018-01-04 ENCOUNTER — Ambulatory Visit (HOSPITAL_COMMUNITY)
Admission: RE | Admit: 2018-01-04 | Discharge: 2018-01-04 | Disposition: A | Discharge status: Home / Self Care | Payer: Non-veteran care | Source: Ambulatory Visit | Attending: Family Medicine | Admitting: Family Medicine

## 2018-01-04 ENCOUNTER — Other Ambulatory Visit (HOSPITAL_COMMUNITY): Payer: Self-pay | Admitting: Obstetrics and Gynecology

## 2018-01-04 ENCOUNTER — Encounter (HOSPITAL_COMMUNITY): Payer: Self-pay

## 2018-01-04 DIAGNOSIS — O09523 Supervision of elderly multigravida, third trimester: Secondary | ICD-10-CM | POA: Diagnosis present

## 2018-01-04 DIAGNOSIS — O10919 Unspecified pre-existing hypertension complicating pregnancy, unspecified trimester: Secondary | ICD-10-CM

## 2018-01-04 DIAGNOSIS — Z362 Encounter for other antenatal screening follow-up: Secondary | ICD-10-CM | POA: Insufficient documentation

## 2018-01-04 DIAGNOSIS — O10013 Pre-existing essential hypertension complicating pregnancy, third trimester: Secondary | ICD-10-CM | POA: Diagnosis present

## 2018-01-04 DIAGNOSIS — Z3A33 33 weeks gestation of pregnancy: Secondary | ICD-10-CM | POA: Insufficient documentation

## 2018-01-08 ENCOUNTER — Ambulatory Visit (INDEPENDENT_AMBULATORY_CARE_PROVIDER_SITE_OTHER): Payer: Medicare HMO | Admitting: *Deleted

## 2018-01-08 ENCOUNTER — Ambulatory Visit (INDEPENDENT_AMBULATORY_CARE_PROVIDER_SITE_OTHER): Payer: Medicare HMO | Admitting: Obstetrics and Gynecology

## 2018-01-08 ENCOUNTER — Ambulatory Visit: Payer: Self-pay

## 2018-01-08 ENCOUNTER — Encounter: Payer: Self-pay | Admitting: Obstetrics and Gynecology

## 2018-01-08 VITALS — BP 112/63 | HR 90 | Wt 211.1 lb

## 2018-01-08 DIAGNOSIS — O26899 Other specified pregnancy related conditions, unspecified trimester: Secondary | ICD-10-CM

## 2018-01-08 DIAGNOSIS — O10013 Pre-existing essential hypertension complicating pregnancy, third trimester: Secondary | ICD-10-CM | POA: Diagnosis not present

## 2018-01-08 DIAGNOSIS — O10019 Pre-existing essential hypertension complicating pregnancy, unspecified trimester: Secondary | ICD-10-CM

## 2018-01-08 DIAGNOSIS — O09523 Supervision of elderly multigravida, third trimester: Secondary | ICD-10-CM

## 2018-01-08 DIAGNOSIS — Z6791 Unspecified blood type, Rh negative: Secondary | ICD-10-CM

## 2018-01-08 DIAGNOSIS — O09899 Supervision of other high risk pregnancies, unspecified trimester: Secondary | ICD-10-CM

## 2018-01-08 DIAGNOSIS — Z3480 Encounter for supervision of other normal pregnancy, unspecified trimester: Secondary | ICD-10-CM

## 2018-01-08 NOTE — Progress Notes (Signed)
Pt has concerns regarding fetal testing - feels may not be indicated. She states her BP was elevated only twice and was over 5 years ago.

## 2018-01-08 NOTE — Progress Notes (Signed)

## 2018-01-08 NOTE — Progress Notes (Signed)
Prenatal Visit Note Date: 01/08/2018 Clinic: [facility]  Subjective:  Pamela Duncan is a 37 y.o. [phone] at 622w3d being seen today for ongoing prenatal care.  She is currently monitored for the following issues for this high-risk pregnancy and has Supervision of other normal pregnancy, antepartum; Rh negative state in antepartum period; Anxiety disorder affecting pregnancy, antepartum; Depression; Overactive bladder; Hypertension in pregnancy, antepartum; Advanced maternal age in multigravida; Benign essential hypertension; Tobacco smoking affecting pregnancy in second trimester; Insomnia; Nausea; and Constipation in pregnancy in second trimester on their problem list.  Patient reports no complaints.   Contractions: Not present. Vag. Bleeding: None.  Movement: Present. Denies leaking of fluid.   The following portions of the patient's history were reviewed and updated as appropriate: allergies, current medications, past family history, past medical history, past social history, past surgical history and problem list. Problem list updated.  Objective:   Vitals:   01/08/18 0838  BP: 112/63  Pulse: 90  Weight: 211 lb 1.6 oz (95.8 kg)    Fetal Status: Fetal Heart Rate (bpm): NST   Movement: Present     General:  Alert, oriented and cooperative. Patient is in no acute distress.  Skin: Skin is warm and dry. No rash noted.   Cardiovascular: Normal heart rate noted  Respiratory: Normal respiratory effort, no problems with respiration noted  Abdomen: Soft, gravid, appropriate for gestational age. Pain/Pressure: Absent     Pelvic:  Cervical exam deferred        Extremities: Normal range of motion.  Edema: None  Mental Status: Normal mood and affect. Normal behavior. Normal judgment and thought content.   Urinalysis:      Assessment and Plan:  Pregnancy: G5P1031 at 192w3d  1. Supervision of high risk pregnancy, antepartum IUD.   2. Elderly multigravida in third  trimester No issues  3. Pre-existing essential hypertension during pregnancy, antepartum D/w pt that cHTN is borderline but recommend continuing with testing which she is amenable to. Recent growth u/s normal and bpp 8/10 (-2 for breathing). Repeat one week  4. Rh negative state in antepartum period Repeat pp prn  Preterm labor symptoms and general obstetric precautions including but not limited to vaginal bleeding, contractions, leaking of fluid and fetal movement were reviewed in detail with the patient. Please refer to After Visit Summary for other counseling recommendations.  Return in about 1 week (around 01/15/2018) for hrob, nst/afi.   [employer] BingPickens, Pietro Bonura, MD

## 2018-01-15 ENCOUNTER — Encounter: Payer: Medicare HMO | Admitting: Obstetrics & Gynecology

## 2018-01-15 ENCOUNTER — Other Ambulatory Visit: Payer: Medicare HMO

## 2018-01-15 ENCOUNTER — Telehealth: Payer: Self-pay | Admitting: General Practice

## 2018-01-15 NOTE — Telephone Encounter (Signed)
Patient called nurse line and cancelled appointments for today.  Called patient to reschedule appts, but no answer. Left message on VM for patient to give our office a call to reschedule.

## 2018-01-23 ENCOUNTER — Ambulatory Visit (INDEPENDENT_AMBULATORY_CARE_PROVIDER_SITE_OTHER): Payer: Medicare HMO | Admitting: *Deleted

## 2018-01-23 ENCOUNTER — Ambulatory Visit: Payer: Self-pay

## 2018-01-23 VITALS — BP 114/69 | HR 78 | Wt 213.1 lb

## 2018-01-23 DIAGNOSIS — O10013 Pre-existing essential hypertension complicating pregnancy, third trimester: Secondary | ICD-10-CM

## 2018-01-23 DIAGNOSIS — O10019 Pre-existing essential hypertension complicating pregnancy, unspecified trimester: Secondary | ICD-10-CM

## 2018-01-23 NOTE — Progress Notes (Signed)
Patient Name: Pamela Duncan, female   DOB: 09-12-1981, 37 y.o.  MRN: [id number]  BPP 10/10 NST reactive  Levie HeritageJacob J Stinson, DO

## 2018-01-23 NOTE — Progress Notes (Signed)

## 2018-01-30 ENCOUNTER — Other Ambulatory Visit (HOSPITAL_COMMUNITY)
Admission: RE | Admit: 2018-01-30 | Discharge: 2018-01-30 | Disposition: A | Discharge status: Home / Self Care | Payer: Medicare HMO | Source: Ambulatory Visit | Attending: Obstetrics and Gynecology | Admitting: Obstetrics and Gynecology

## 2018-01-30 ENCOUNTER — Ambulatory Visit (INDEPENDENT_AMBULATORY_CARE_PROVIDER_SITE_OTHER): Payer: Medicare HMO | Admitting: *Deleted

## 2018-01-30 ENCOUNTER — Encounter: Payer: Self-pay | Admitting: Obstetrics and Gynecology

## 2018-01-30 ENCOUNTER — Ambulatory Visit (INDEPENDENT_AMBULATORY_CARE_PROVIDER_SITE_OTHER): Payer: Medicare HMO | Admitting: Obstetrics and Gynecology

## 2018-01-30 VITALS — BP 123/68 | HR 90 | Wt 213.1 lb

## 2018-01-30 DIAGNOSIS — O10019 Pre-existing essential hypertension complicating pregnancy, unspecified trimester: Secondary | ICD-10-CM

## 2018-01-30 DIAGNOSIS — O10013 Pre-existing essential hypertension complicating pregnancy, third trimester: Secondary | ICD-10-CM | POA: Diagnosis not present

## 2018-01-30 DIAGNOSIS — Z3480 Encounter for supervision of other normal pregnancy, unspecified trimester: Secondary | ICD-10-CM | POA: Insufficient documentation

## 2018-01-30 DIAGNOSIS — O26899 Other specified pregnancy related conditions, unspecified trimester: Secondary | ICD-10-CM

## 2018-01-30 DIAGNOSIS — O09523 Supervision of elderly multigravida, third trimester: Secondary | ICD-10-CM

## 2018-01-30 DIAGNOSIS — Z6791 Unspecified blood type, Rh negative: Secondary | ICD-10-CM

## 2018-01-30 DIAGNOSIS — O26893 Other specified pregnancy related conditions, third trimester: Secondary | ICD-10-CM

## 2018-01-30 LAB — OB RESULTS CONSOLE GC/CHLAMYDIA: GC PROBE AMP, GENITAL: NEGATIVE

## 2018-01-30 NOTE — Progress Notes (Signed)
   PRENATAL VISIT NOTE  Subjective:  Pamela Duncan is a 37 y.o. [id number] at 4730w4d being seen today for ongoing prenatal care.  She is currently monitored for the following issues for this high-risk pregnancy and has Supervision of other normal pregnancy, antepartum; Rh negative state in antepartum period; Anxiety disorder affecting pregnancy, antepartum; Depression; Overactive bladder; Hypertension in pregnancy, antepartum; Advanced maternal age in multigravida; Benign essential hypertension; Tobacco smoking affecting pregnancy in second trimester; Insomnia; Nausea; and Constipation in pregnancy in second trimester on their problem list.  Patient reports no complaints.  Contractions: Not present. Vag. Bleeding: None.  Movement: Present. Denies leaking of fluid.   The following portions of the patient's history were reviewed and updated as appropriate: allergies, current medications, past family history, past medical history, past social history, past surgical history and problem list. Problem list updated.  Objective:   Vitals:   01/30/18 0931  BP: 123/68  Pulse: 90  Weight: 213 lb 1.6 oz (96.7 kg)    Fetal Status: Fetal Heart Rate (bpm): NST   Movement: Present  Presentation: Vertex  General:  Alert, oriented and cooperative. Patient is in no acute distress.  Skin: Skin is warm and dry. No rash noted.   Cardiovascular: Normal heart rate noted  Respiratory: Normal respiratory effort, no problems with respiration noted  Abdomen: Soft, gravid, appropriate for gestational age.  Pain/Pressure: Present     Pelvic: Cervical exam performed Dilation: Closed Effacement (%): Thick Station: -3  Extremities: Normal range of motion.  Edema: None  Mental Status: Normal mood and affect. Normal behavior. Normal judgment and thought content.   Assessment and Plan:  Pregnancy: G5P1031 at 9030w4d  1. Supervision of other normal pregnancy, antepartum Patient is doing well without complaints Cultures  today - US MFM FETAL BPP WO NON STRESS; Future - GC[facility]Chlam[facility] - Strep Gp B NAA  2. Pre-existing essential hypertension during pregnancy, antepartum Normotensive without medication Continue antenatal testing NST reviewed and reactive Follow up growth and BPP on 4/11 - US MFM FETAL BPP WO NON STRESS; Future  3. Multigravida of advanced maternal age in third trimester Normal NIPS  4. Rh negative state in antepartum period S/p rhogam  Follow up pp  Term labor symptoms and general obstetric precautions including but not limited to vaginal bleeding, contractions, leaking of fluid and fetal movement were reviewed in detail with the patient. Please refer to After Visit Summary for other counseling recommendations.  No follow-ups on file.  Future Appointments  Date Time Provider Department Center  01/30/2018 10:15 AM Lucie Friedlander, Gigi GinPeggy, MD [facility]  02/01/2018  8:45 AM [facility] US 5 [facility]  02/06/2018 10:15 AM [facility] NST [facility]  02/06/2018 11:15 AM Hermina StaggersErvin, Michael L, MD [facility]  02/13/2018  9:15 AM [facility] NST [facility]  02/13/2018 10:15 AM Allie Bossierove, Myra C, MD [facility]  02/20/2018 10:15 AM [facility] NST [facility]  02/20/2018 11:15 AM Alysia PennaErvin, Marolyn HammockMichael L, [facility]    Catalina AntiguaPeggy Brayten Komar, MD

## 2018-01-30 NOTE — Progress Notes (Signed)
US for growth scheduled on 4/11, BPP added

## 2018-01-31 LAB — GC/CHLAMYDIA PROBE AMP (~~LOC~~) NOT AT ARMC
Chlamydia: NEGATIVE
NEISSERIA GONORRHEA: NEGATIVE

## 2018-02-01 ENCOUNTER — Ambulatory Visit (HOSPITAL_COMMUNITY)
Admission: RE | Admit: 2018-02-01 | Discharge: 2018-02-01 | Disposition: A | Discharge status: Home / Self Care | Payer: Non-veteran care | Source: Ambulatory Visit | Attending: Family Medicine | Admitting: Family Medicine

## 2018-02-01 ENCOUNTER — Encounter (HOSPITAL_COMMUNITY): Payer: Self-pay

## 2018-02-01 DIAGNOSIS — Z3480 Encounter for supervision of other normal pregnancy, unspecified trimester: Secondary | ICD-10-CM

## 2018-02-01 DIAGNOSIS — O10013 Pre-existing essential hypertension complicating pregnancy, third trimester: Secondary | ICD-10-CM | POA: Insufficient documentation

## 2018-02-01 DIAGNOSIS — Z362 Encounter for other antenatal screening follow-up: Secondary | ICD-10-CM | POA: Diagnosis not present

## 2018-02-01 DIAGNOSIS — Z3A37 37 weeks gestation of pregnancy: Secondary | ICD-10-CM | POA: Diagnosis not present

## 2018-02-01 DIAGNOSIS — O10919 Unspecified pre-existing hypertension complicating pregnancy, unspecified trimester: Secondary | ICD-10-CM

## 2018-02-01 DIAGNOSIS — O99213 Obesity complicating pregnancy, third trimester: Secondary | ICD-10-CM | POA: Diagnosis not present

## 2018-02-01 DIAGNOSIS — O10019 Pre-existing essential hypertension complicating pregnancy, unspecified trimester: Secondary | ICD-10-CM

## 2018-02-01 DIAGNOSIS — O09523 Supervision of elderly multigravida, third trimester: Secondary | ICD-10-CM | POA: Insufficient documentation

## 2018-02-01 LAB — STREP GP B NAA: STREP GROUP B AG: NEGATIVE

## 2018-02-06 ENCOUNTER — Encounter: Payer: Self-pay | Admitting: Obstetrics and Gynecology

## 2018-02-06 ENCOUNTER — Telehealth (HOSPITAL_COMMUNITY): Payer: Self-pay | Admitting: *Deleted

## 2018-02-06 ENCOUNTER — Ambulatory Visit: Payer: Self-pay

## 2018-02-06 ENCOUNTER — Ambulatory Visit (INDEPENDENT_AMBULATORY_CARE_PROVIDER_SITE_OTHER): Payer: Medicare HMO | Admitting: Obstetrics and Gynecology

## 2018-02-06 ENCOUNTER — Ambulatory Visit (INDEPENDENT_AMBULATORY_CARE_PROVIDER_SITE_OTHER): Payer: Medicare HMO | Admitting: *Deleted

## 2018-02-06 ENCOUNTER — Encounter (HOSPITAL_COMMUNITY): Payer: Self-pay | Admitting: *Deleted

## 2018-02-06 ENCOUNTER — Encounter: Payer: Self-pay | Admitting: *Deleted

## 2018-02-06 VITALS — BP 120/74 | HR 83 | Wt 215.7 lb

## 2018-02-06 DIAGNOSIS — O26893 Other specified pregnancy related conditions, third trimester: Secondary | ICD-10-CM

## 2018-02-06 DIAGNOSIS — O10019 Pre-existing essential hypertension complicating pregnancy, unspecified trimester: Secondary | ICD-10-CM

## 2018-02-06 DIAGNOSIS — O10013 Pre-existing essential hypertension complicating pregnancy, third trimester: Secondary | ICD-10-CM

## 2018-02-06 DIAGNOSIS — Z3483 Encounter for supervision of other normal pregnancy, third trimester: Secondary | ICD-10-CM

## 2018-02-06 DIAGNOSIS — O26899 Other specified pregnancy related conditions, unspecified trimester: Secondary | ICD-10-CM

## 2018-02-06 DIAGNOSIS — Z6791 Unspecified blood type, Rh negative: Secondary | ICD-10-CM

## 2018-02-06 DIAGNOSIS — O09523 Supervision of elderly multigravida, third trimester: Secondary | ICD-10-CM

## 2018-02-06 DIAGNOSIS — Z3480 Encounter for supervision of other normal pregnancy, unspecified trimester: Secondary | ICD-10-CM

## 2018-02-06 MED ORDER — CYCLOBENZAPRINE HCL 10 MG PO TABS: 10.0000 mg | ORAL_TABLET | Freq: Three times a day (TID) | ORAL | 2 refills | Status: DC | PRN

## 2018-02-06 NOTE — Patient Instructions (Signed)
Vaginal Delivery Vaginal delivery means that you will give birth by pushing your baby out of your birth canal (vagina). A team of health care providers will help you before, during, and after vaginal delivery. Birth experiences are unique for every woman and every pregnancy, and birth experiences vary depending on where you choose to give birth. What should I do to prepare for my baby's birth? Before your baby is born, it is important to talk with your health care provider about:  Your labor and delivery preferences. These may include: ? Medicines that you may be given. ? How you will manage your pain. This might include non-medical pain relief techniques or injectable pain relief such as epidural analgesia. ? How you and your baby will be monitored during labor and delivery. ? Who may be in the labor and delivery room with you. ? Your feelings about surgical delivery of your baby (cesarean delivery, or C-section) if this becomes necessary. ? Your feelings about receiving donated blood through an IV tube (blood transfusion) if this becomes necessary.  Whether you are able: ? To take pictures or videos of the birth. ? To eat during labor and delivery. ? To move around, walk, or change positions during labor and delivery.  What to expect after your baby is born, such as: ? Whether delayed umbilical cord clamping and cutting is offered. ? Who will care for your baby right after birth. ? Medicines or tests that may be recommended for your baby. ? Whether breastfeeding is supported in your hospital or birth center. ? How long you will be in the hospital or birth center.  How any medical conditions you have may affect your baby or your labor and delivery experience.  To prepare for your baby's birth, you should also:  Attend all of your health care visits before delivery (prenatal visits) as recommended by your health care provider. This is important.  Prepare your home for your baby's  arrival. Make sure that you have: ? Diapers. ? Baby clothing. ? Feeding equipment. ? Safe sleeping arrangements for you and your baby.  Install a car seat in your vehicle. Have your car seat checked by a certified car seat installer to make sure that it is installed safely.  Think about who will help you with your new baby at home for at least the first several weeks after delivery.  What can I expect when I arrive at the birth center or hospital? Once you are in labor and have been admitted into the hospital or birth center, your health care provider may:  Review your pregnancy history and any concerns you have.  Insert an IV tube into one of your veins. This is used to give you fluids and medicines.  Check your blood pressure, pulse, temperature, and heart rate (vital signs).  Check whether your bag of water (amniotic sac) has broken (ruptured).  Talk with you about your birth plan and discuss pain control options.  Monitoring Your health care provider may monitor your contractions (uterine monitoring) and your baby's heart rate (fetal monitoring). You may need to be monitored:  Often, but not continuously (intermittently).  All the time or for long periods at a time (continuously). Continuous monitoring may be needed if: ? You are taking certain medicines, such as medicine to relieve pain or make your contractions stronger. ? You have pregnancy or labor complications.  Monitoring may be done by:  Placing a special stethoscope or a handheld monitoring device on your abdomen to   check your baby's heartbeat, and feeling your abdomen for contractions. This method of monitoring does not continuously record your baby's heartbeat or your contractions.  Placing monitors on your abdomen (external monitors) to record your baby's heartbeat and the frequency and length of contractions. You may not have to wear external monitors all the time.  Placing monitors inside of your uterus  (internal monitors) to record your baby's heartbeat and the frequency, length, and strength of your contractions. ? Your health care provider may use internal monitors if he or she needs more information about the strength of your contractions or your baby's heart rate. ? Internal monitors are put in place by passing a thin, flexible wire through your vagina and into your uterus. Depending on the type of monitor, it may remain in your uterus or on your baby's head until birth. ? Your health care provider will discuss the benefits and risks of internal monitoring with you and will ask for your permission before inserting the monitors.  Telemetry. This is a type of continuous monitoring that can be done with external or internal monitors. Instead of having to stay in bed, you are able to move around during telemetry. Ask your health care provider if telemetry is an option for you.  Physical exam Your health care provider may perform a physical exam. This may include:  Checking whether your baby is positioned: ? With the head toward your vagina (head-down). This is most common. ? With the head toward the top of your uterus (head-up or breech). If your baby is in a breech position, your health care provider may try to turn your baby to a head-down position so you can deliver vaginally. If it does not seem that your baby can be born vaginally, your provider may recommend surgery to deliver your baby. In rare cases, you may be able to deliver vaginally if your baby is head-up (breech delivery). ? Lying sideways (transverse). Babies that are lying sideways cannot be delivered vaginally.  Checking your cervix to determine: ? Whether it is thinning out (effacing). ? Whether it is opening up (dilating). ? How low your baby has moved into your birth canal.  What are the three stages of labor and delivery?  Normal labor and delivery is divided into the following three stages: Stage 1  Stage 1 is the  longest stage of labor, and it can last for hours or days. Stage 1 includes: ? Early labor. This is when contractions may be irregular, or regular and mild. Generally, early labor contractions are more than 10 minutes apart. ? Active labor. This is when contractions get longer, more regular, more frequent, and more intense. ? The transition phase. This is when contractions happen very close together, are very intense, and may last longer than during any other part of labor.  Contractions generally feel mild, infrequent, and irregular at first. They get stronger, more frequent (about every 2-3 minutes), and more regular as you progress from early labor through active labor and transition.  Many women progress through stage 1 naturally, but you may need help to continue making progress. If this happens, your health care provider may talk with you about: ? Rupturing your amniotic sac if it has not ruptured yet. ? Giving you medicine to help make your contractions stronger and more frequent.  Stage 1 ends when your cervix is completely dilated to 4 inches (10 cm) and completely effaced. This happens at the end of the transition phase. Stage 2  Once   your cervix is completely effaced and dilated to 4 inches (10 cm), you may start to feel an urge to push. It is common for the body to naturally take a rest before feeling the urge to push, especially if you received an epidural or certain other pain medicines. This rest period may last for up to 1-2 hours, depending on your unique labor experience.  During stage 2, contractions are generally less painful, because pushing helps relieve contraction pain. Instead of contraction pain, you may feel stretching and burning pain, especially when the widest part of your baby's head passes through the vaginal opening (crowning).  Your health care provider will closely monitor your pushing progress and your baby's progress through the vagina during stage 2.  Your  health care provider may massage the area of skin between your vaginal opening and anus (perineum) or apply warm compresses to your perineum. This helps it stretch as the baby's head starts to crown, which can help prevent perineal tearing. ? In some cases, an incision may be made in your perineum (episiotomy) to allow the baby to pass through the vaginal opening. An episiotomy helps to make the opening of the vagina larger to allow more room for the baby to fit through.  It is very important to breathe and focus so your health care provider can control the delivery of your baby's head. Your health care provider may have you decrease the intensity of your pushing, to help prevent perineal tearing.  After delivery of your baby's head, the shoulders and the rest of the body generally deliver very quickly and without difficulty.  Once your baby is delivered, the umbilical cord may be cut right away, or this may be delayed for 1-2 minutes, depending on your baby's health. This may vary among health care providers, hospitals, and birth centers.  If you and your baby are healthy enough, your baby may be placed on your chest or abdomen to help maintain the baby's temperature and to help you bond with each other. Some mothers and babies start breastfeeding at this time. Your health care team will dry your baby and help keep your baby warm during this time.  Your baby may need immediate care if he or she: ? Showed signs of distress during labor. ? Has a medical condition. ? Was born too early (prematurely). ? Had a bowel movement before birth (meconium). ? Shows signs of difficulty transitioning from being inside the uterus to being outside of the uterus. If you are planning to breastfeed, your health care team will help you begin a feeding. Stage 3  The third stage of labor starts immediately after the birth of your baby and ends after you deliver the placenta. The placenta is an organ that develops  during pregnancy to provide oxygen and nutrients to your baby in the womb.  Delivering the placenta may require some pushing, and you may have mild contractions. Breastfeeding can stimulate contractions to help you deliver the placenta.  After the placenta is delivered, your uterus should tighten (contract) and become firm. This helps to stop bleeding in your uterus. To help your uterus contract and to control bleeding, your health care provider may: ? Give you medicine by injection, through an IV tube, by mouth, or through your rectum (rectally). ? Massage your abdomen or perform a vaginal exam to remove any blood clots that are left in your uterus. ? Empty your bladder by placing a thin, flexible tube (catheter) into your bladder. ? Encourage   you to breastfeed your baby. After labor is over, you and your baby will be monitored closely to ensure that you are both healthy until you are ready to go home. Your health care team will teach you how to care for yourself and your baby. This information is not intended to replace advice given to you by your health care provider. Make sure you discuss any questions you have with your health care provider. Document Released: 07/19/2008 Document Revised: 04/29/2016 Document Reviewed: 10/25/2015 Elsevier Interactive Patient Education  2018 [employer] Inc[employer]  

## 2018-02-06 NOTE — Progress Notes (Signed)
Pt reports frequent back and hip pain. She is having difficulty sleeping.

## 2018-02-06 NOTE — Progress Notes (Signed)
Subjective:  Pamela Duncan is a 37 y.o. [id number] at 5589w4d being seen today for ongoing prenatal care.  She is currently monitored for the following issues for this high-risk pregnancy and has Supervision of other normal pregnancy, antepartum; Rh negative state in antepartum period; Anxiety disorder affecting pregnancy, antepartum; Depression; Overactive bladder; Hypertension in pregnancy, antepartum; Advanced maternal age in multigravida; Benign essential hypertension; Tobacco smoking affecting pregnancy in second trimester; Insomnia; Nausea; and Constipation in pregnancy in second trimester on their problem list.  Patient reports general discomforts of pregnancy.  Contractions: Irregular. Vag. Bleeding: None.  Movement: Present. Denies leaking of fluid.   The following portions of the patient's history were reviewed and updated as appropriate: allergies, current medications, past family history, past medical history, past social history, past surgical history and problem list. Problem list updated.  Objective:   Vitals:   02/06/18 1057  BP: 120/74  Pulse: 83  Weight: 215 lb 11.2 oz (97.8 kg)    Fetal Status: Fetal Heart Rate (bpm): NST   Movement: Present     General:  Alert, oriented and cooperative. Patient is in no acute distress.  Skin: Skin is warm and dry. No rash noted.   Cardiovascular: Normal heart rate noted  Respiratory: Normal respiratory effort, no problems with respiration noted  Abdomen: Soft, gravid, appropriate for gestational age. Pain/Pressure: Present     Pelvic:  Cervical exam performed        Extremities: Normal range of motion.  Edema: None  Mental Status: Normal mood and affect. Normal behavior. Normal judgment and thought content.   Urinalysis:      Assessment and Plan:  Pregnancy: G5P1031 at 1789w4d  1. Supervision of other normal pregnancy, antepartum Stable  2. Rh negative state in antepartum period S/P Rhogam, f/u PP  3. Pre-existing essential  hypertension during pregnancy, antepartum BP stable no meds BPP 10/10 today Continue with antenatal testing Appropriate growth on 02/01/18 U/S  4. Multigravida of advanced maternal age in third trimester Nl NIPS  Term labor symptoms and general obstetric precautions including but not limited to vaginal bleeding, contractions, leaking of fluid and fetal movement were reviewed in detail with the patient. Please refer to After Visit Summary for other counseling recommendations.  Return in about 1 week (around 02/13/2018) for as scheduled.   Hermina StaggersErvin, Tamirra Sienkiewicz L, MD

## 2018-02-06 NOTE — Progress Notes (Signed)

## 2018-02-06 NOTE — Telephone Encounter (Signed)
Preadmission screen  

## 2018-02-11 ENCOUNTER — Other Ambulatory Visit: Payer: Self-pay | Admitting: Advanced Practice Midwife

## 2018-02-13 ENCOUNTER — Ambulatory Visit (INDEPENDENT_AMBULATORY_CARE_PROVIDER_SITE_OTHER): Payer: Medicare HMO | Admitting: Obstetrics & Gynecology

## 2018-02-13 ENCOUNTER — Ambulatory Visit: Payer: Self-pay

## 2018-02-13 ENCOUNTER — Ambulatory Visit (INDEPENDENT_AMBULATORY_CARE_PROVIDER_SITE_OTHER): Payer: Medicare HMO | Admitting: *Deleted

## 2018-02-13 VITALS — BP 107/75 | HR 99 | Wt 217.1 lb

## 2018-02-13 DIAGNOSIS — O10013 Pre-existing essential hypertension complicating pregnancy, third trimester: Secondary | ICD-10-CM

## 2018-02-13 DIAGNOSIS — O10019 Pre-existing essential hypertension complicating pregnancy, unspecified trimester: Secondary | ICD-10-CM

## 2018-02-13 DIAGNOSIS — O09523 Supervision of elderly multigravida, third trimester: Secondary | ICD-10-CM

## 2018-02-13 DIAGNOSIS — O99210 Obesity complicating pregnancy, unspecified trimester: Secondary | ICD-10-CM

## 2018-02-13 DIAGNOSIS — Z3480 Encounter for supervision of other normal pregnancy, unspecified trimester: Secondary | ICD-10-CM

## 2018-02-13 DIAGNOSIS — O26899 Other specified pregnancy related conditions, unspecified trimester: Secondary | ICD-10-CM

## 2018-02-13 DIAGNOSIS — Z6791 Unspecified blood type, Rh negative: Secondary | ICD-10-CM

## 2018-02-13 LAB — POCT URINALYSIS DIP (DEVICE)
Bilirubin Urine: NEGATIVE
GLUCOSE, UA: NEGATIVE mg/dL
HGB URINE DIPSTICK: NEGATIVE
Ketones, ur: NEGATIVE mg/dL
NITRITE: NEGATIVE
Protein, ur: 30 mg/dL — AB
Specific Gravity, Urine: 1.02 (ref 1.005–1.030)
UROBILINOGEN UA: 0.2 mg/dL (ref 0.0–1.0)
pH: 7 (ref 5.0–8.0)

## 2018-02-13 NOTE — Progress Notes (Signed)

## 2018-02-13 NOTE — Progress Notes (Signed)
I have reviewed this chart and agree with the RN/CMA assessment and management.    Colton Tassin C Sarahann Horrell, MD, FACOG Attending Physician, Faculty Practice [facility]  

## 2018-02-13 NOTE — Progress Notes (Signed)
Pt reports increased back pain and UC's.  IOL scheduled on 4/26.

## 2018-02-13 NOTE — Progress Notes (Signed)
   PRENATAL VISIT NOTE  Subjective:  Pamela Duncan is a 37 y.o. [id number] at 8268w4d being seen today for ongoing prenatal care.  She is currently monitored for the following issues for this high-risk pregnancy and has Supervision of other normal pregnancy, antepartum; Rh negative state in antepartum period; Anxiety disorder affecting pregnancy, antepartum; Depression; Overactive bladder; Hypertension in pregnancy, antepartum; Advanced maternal age in multigravida; Benign essential hypertension; Tobacco smoking affecting pregnancy in second trimester; Insomnia; Nausea; Constipation in pregnancy in second trimester; and Obesity during pregnancy on their problem list.  Patient reports contractions for days.  Contractions: Irregular. Vag. Bleeding: None.  Movement: Present. Denies leaking of fluid.   The following portions of the patient's history were reviewed and updated as appropriate: allergies, current medications, past family history, past medical history, past social history, past surgical history and problem list. Problem list updated.  Objective:   Vitals:   02/13/18 1003  BP: 107/75  Pulse: 99  Weight: 217 lb 1.6 oz (98.5 kg)    Fetal Status: Fetal Heart Rate (bpm): NST   Movement: Present     General:  Alert, oriented and cooperative. Patient is in no acute distress.  Skin: Skin is warm and dry. No rash noted.   Cardiovascular: Normal heart rate noted  Respiratory: Normal respiratory effort, no problems with respiration noted  Abdomen: Soft, gravid, appropriate for gestational age.  Pain/Pressure: Present     Pelvic: Cervical exam performed        Extremities: Normal range of motion.  Edema: None  Mental Status: Normal mood and affect. Normal behavior. Normal judgment and thought content.   Assessment and Plan:  Pregnancy: G5P1031 at 8468w4d  1. Supervision of other normal pregnancy, antepartum   2. Pre-existing essential hypertension during pregnancy, antepartum - IOL  02-16-18  3. Multigravida of advanced maternal age in third trimester   4. Rh negative state in antepartum period   5. Obesity during pregnancy - check hbga1c  - Hemoglobin A1c  Term labor symptoms and general obstetric precautions including but not limited to vaginal bleeding, contractions, leaking of fluid and fetal movement were reviewed in detail with the patient. Please refer to After Visit Summary for other counseling recommendations.  Return in about 5 weeks (around 03/20/2018) for PP visit.  IOL on 4/26.  Future Appointments  Date Time Provider Department Center  02/16/2018 12:00 AM [facility] None    Pamela BossierMyra C Lillybeth Tal, MD

## 2018-02-14 ENCOUNTER — Encounter (HOSPITAL_COMMUNITY): Payer: Self-pay | Admitting: *Deleted

## 2018-02-14 ENCOUNTER — Inpatient Hospital Stay (HOSPITAL_COMMUNITY)
Admission: AD | Admit: 2018-02-14 | Discharge: 2018-02-14 | Disposition: A | Discharge status: Home / Self Care | Payer: Medicare HMO | Source: Ambulatory Visit | Attending: Obstetrics and Gynecology | Admitting: Obstetrics and Gynecology

## 2018-02-14 ENCOUNTER — Inpatient Hospital Stay (HOSPITAL_COMMUNITY)
Admission: AD | Admit: 2018-02-14 | Discharge: 2018-02-16 | DRG: 807 | Disposition: A | Discharge status: Home / Self Care | Payer: Medicare HMO | Source: Ambulatory Visit | Attending: Obstetrics & Gynecology | Admitting: Obstetrics & Gynecology

## 2018-02-14 ENCOUNTER — Other Ambulatory Visit: Payer: Self-pay

## 2018-02-14 DIAGNOSIS — O26899 Other specified pregnancy related conditions, unspecified trimester: Secondary | ICD-10-CM

## 2018-02-14 DIAGNOSIS — O134 Gestational [pregnancy-induced] hypertension without significant proteinuria, complicating childbirth: Principal | ICD-10-CM | POA: Diagnosis present

## 2018-02-14 DIAGNOSIS — N3281 Overactive bladder: Secondary | ICD-10-CM

## 2018-02-14 DIAGNOSIS — O10019 Pre-existing essential hypertension complicating pregnancy, unspecified trimester: Secondary | ICD-10-CM

## 2018-02-14 DIAGNOSIS — O99214 Obesity complicating childbirth: Secondary | ICD-10-CM | POA: Diagnosis present

## 2018-02-14 DIAGNOSIS — O99344 Other mental disorders complicating childbirth: Secondary | ICD-10-CM | POA: Diagnosis present

## 2018-02-14 DIAGNOSIS — O99343 Other mental disorders complicating pregnancy, third trimester: Secondary | ICD-10-CM

## 2018-02-14 DIAGNOSIS — Z3480 Encounter for supervision of other normal pregnancy, unspecified trimester: Secondary | ICD-10-CM

## 2018-02-14 DIAGNOSIS — O99612 Diseases of the digestive system complicating pregnancy, second trimester: Secondary | ICD-10-CM

## 2018-02-14 DIAGNOSIS — F5101 Primary insomnia: Secondary | ICD-10-CM

## 2018-02-14 DIAGNOSIS — F329 Major depressive disorder, single episode, unspecified: Secondary | ICD-10-CM

## 2018-02-14 DIAGNOSIS — R110 Nausea: Secondary | ICD-10-CM

## 2018-02-14 DIAGNOSIS — Z91040 Latex allergy status: Secondary | ICD-10-CM

## 2018-02-14 DIAGNOSIS — O99332 Smoking (tobacco) complicating pregnancy, second trimester: Secondary | ICD-10-CM

## 2018-02-14 DIAGNOSIS — Z6791 Unspecified blood type, Rh negative: Secondary | ICD-10-CM | POA: Diagnosis not present

## 2018-02-14 DIAGNOSIS — Z3A39 39 weeks gestation of pregnancy: Secondary | ICD-10-CM

## 2018-02-14 DIAGNOSIS — K5900 Constipation, unspecified: Secondary | ICD-10-CM

## 2018-02-14 DIAGNOSIS — O99334 Smoking (tobacco) complicating childbirth: Secondary | ICD-10-CM | POA: Diagnosis present

## 2018-02-14 DIAGNOSIS — F419 Anxiety disorder, unspecified: Secondary | ICD-10-CM | POA: Diagnosis present

## 2018-02-14 DIAGNOSIS — F17290 Nicotine dependence, other tobacco product, uncomplicated: Secondary | ICD-10-CM | POA: Diagnosis present

## 2018-02-14 DIAGNOSIS — O479 False labor, unspecified: Secondary | ICD-10-CM

## 2018-02-14 DIAGNOSIS — O09523 Supervision of elderly multigravida, third trimester: Secondary | ICD-10-CM

## 2018-02-14 DIAGNOSIS — O26893 Other specified pregnancy related conditions, third trimester: Secondary | ICD-10-CM | POA: Diagnosis present

## 2018-02-14 DIAGNOSIS — E669 Obesity, unspecified: Secondary | ICD-10-CM | POA: Diagnosis present

## 2018-02-14 LAB — COMPREHENSIVE METABOLIC PANEL
ALBUMIN: 3.2 g/dL — AB (ref 3.5–5.0)
ALK PHOS: 235 U/L — AB (ref 38–126)
ALT: 20 U/L (ref 14–54)
ANION GAP: 14 (ref 5–15)
AST: 25 U/L (ref 15–41)
BILIRUBIN TOTAL: 0.6 mg/dL (ref 0.3–1.2)
BUN: 10 mg/dL (ref 6–20)
CALCIUM: 9.1 mg/dL (ref 8.9–10.3)
CO2: 17 mmol/L — ABNORMAL LOW (ref 22–32)
Chloride: 106 mmol/L (ref 101–111)
Creatinine, Ser: 0.71 mg/dL (ref 0.44–1.00)
GFR calc Af Amer: >60 mL/min (ref >60)
GLUCOSE: 99 mg/dL (ref 65–99)
POTASSIUM: 4.1 mmol/L (ref 3.5–5.1)
Sodium: 137 mmol/L (ref 135–145)
TOTAL PROTEIN: 6.7 g/dL (ref 6.5–8.1)

## 2018-02-14 LAB — CBC
HCT: 34.2 % — ABNORMAL LOW (ref 36.0–46.0)
Hemoglobin: 11.9 g/dL — ABNORMAL LOW (ref 12.0–15.0)
MCH: 31.4 pg (ref 26.0–34.0)
MCHC: 34.8 g/dL (ref 30.0–36.0)
MCV: 90.2 fL (ref 78.0–100.0)
Platelets: 210 10*3/uL (ref 150–400)
RBC: 3.79 MIL/uL — ABNORMAL LOW (ref 3.87–5.11)
RDW: 13.7 % (ref 11.5–15.5)
WBC: 16.8 10*3/uL — ABNORMAL HIGH (ref 4.0–10.5)

## 2018-02-14 LAB — HEMOGLOBIN A1C
Est. average glucose Bld gHb Est-mCnc: 94 mg/dL
Hgb A1c MFr Bld: 4.9 % (ref 4.8–5.6)

## 2018-02-14 LAB — TYPE AND SCREEN
ABO/RH(D): O NEG
ANTIBODY SCREEN: NEGATIVE

## 2018-02-14 MED ORDER — LACTATED RINGERS IV SOLN
500.0000 mL | INTRAVENOUS | Status: DC | PRN
  Administered 2018-02-14: 1000 mL via INTRAVENOUS

## 2018-02-14 MED ORDER — ACETAMINOPHEN 325 MG PO TABS: 650.0000 mg | ORAL_TABLET | ORAL | Status: DC | PRN

## 2018-02-14 MED ORDER — DIPHENHYDRAMINE HCL 50 MG/ML IJ SOLN: 12.5000 mg | INTRAMUSCULAR | Status: DC | PRN

## 2018-02-14 MED ORDER — SIMETHICONE 80 MG PO CHEW: 80.0000 mg | CHEWABLE_TABLET | ORAL | Status: DC | PRN

## 2018-02-14 MED ORDER — PHENYLEPHRINE 40 MCG/ML (10ML) SYRINGE FOR IV PUSH (FOR BLOOD PRESSURE SUPPORT)
80.0000 ug | PREFILLED_SYRINGE | INTRAVENOUS | Status: DC | PRN
  Filled 2018-02-14: qty 5

## 2018-02-14 MED ORDER — DIBUCAINE 1 % RE OINT: 1.0000 "application " | TOPICAL_OINTMENT | RECTAL | Status: DC | PRN

## 2018-02-14 MED ORDER — ONDANSETRON HCL 4 MG PO TABS
4.0000 mg | ORAL_TABLET | ORAL | Status: DC | PRN
Start: 2018-02-14 — End: 2018-02-16

## 2018-02-14 MED ORDER — IBUPROFEN 600 MG PO TABS
600.0000 mg | ORAL_TABLET | Freq: Four times a day (QID) | ORAL | Status: DC
  Administered 2018-02-14 – 2018-02-16 (×7): 600 mg via ORAL
  Filled 2018-02-14 (×7): qty 1

## 2018-02-14 MED ORDER — DIPHENHYDRAMINE HCL 25 MG PO CAPS: 25.0000 mg | ORAL_CAPSULE | Freq: Four times a day (QID) | ORAL | Status: DC | PRN

## 2018-02-14 MED ORDER — OXYTOCIN 40 UNITS IN LACTATED RINGERS INFUSION - SIMPLE MED
2.5000 [IU]/h | INTRAVENOUS | Status: DC
  Filled 2018-02-14: qty 1000

## 2018-02-14 MED ORDER — COCONUT OIL OIL
1.0000 "application " | TOPICAL_OIL | Status: DC | PRN
  Administered 2018-02-15: 1 via TOPICAL
  Filled 2018-02-14: qty 120

## 2018-02-14 MED ORDER — EPHEDRINE 5 MG/ML INJ
10.0000 mg | INTRAVENOUS | Status: DC | PRN
  Filled 2018-02-14: qty 2

## 2018-02-14 MED ORDER — LACTATED RINGERS IV SOLN: INTRAVENOUS | Status: DC

## 2018-02-14 MED ORDER — SENNOSIDES-DOCUSATE SODIUM 8.6-50 MG PO TABS
2.0000 | ORAL_TABLET | ORAL | Status: DC
  Administered 2018-02-14 – 2018-02-15 (×2): 2 via ORAL
  Filled 2018-02-14 (×2): qty 2

## 2018-02-14 MED ORDER — BENZOCAINE-MENTHOL 20-0.5 % EX AERO
1.0000 "application " | INHALATION_SPRAY | CUTANEOUS | Status: DC | PRN
  Administered 2018-02-15: 1 via TOPICAL
  Filled 2018-02-14: qty 56

## 2018-02-14 MED ORDER — ZOLPIDEM TARTRATE 5 MG PO TABS
5.0000 mg | ORAL_TABLET | Freq: Every evening | ORAL | Status: DC | PRN
Start: 2018-02-14 — End: 2018-02-16

## 2018-02-14 MED ORDER — LACTATED RINGERS IV SOLN
500.0000 mL | Freq: Once | INTRAVENOUS | Status: AC
  Administered 2018-02-14: 500 mL via INTRAVENOUS

## 2018-02-14 MED ORDER — PHENYLEPHRINE 40 MCG/ML (10ML) SYRINGE FOR IV PUSH (FOR BLOOD PRESSURE SUPPORT)
PREFILLED_SYRINGE | INTRAVENOUS | Status: AC
  Filled 2018-02-14: qty 10

## 2018-02-14 MED ORDER — FLUOXETINE HCL 20 MG PO TABS
20.0000 mg | ORAL_TABLET | Freq: Every day | ORAL | Status: DC
  Administered 2018-02-15 – 2018-02-16 (×2): 20 mg via ORAL
  Filled 2018-02-14 (×3): qty 1

## 2018-02-14 MED ORDER — TETANUS-DIPHTH-ACELL PERTUSSIS 5-2.5-18.5 LF-MCG/0.5 IM SUSP: 0.5000 mL | Freq: Once | INTRAMUSCULAR | Status: DC

## 2018-02-14 MED ORDER — FENTANYL CITRATE (PF) 100 MCG/2ML IJ SOLN
100.0000 ug | INTRAMUSCULAR | Status: DC | PRN
  Administered 2018-02-14 (×2): 100 ug via INTRAVENOUS
  Filled 2018-02-14 (×2): qty 2

## 2018-02-14 MED ORDER — ONDANSETRON HCL 4 MG/2ML IJ SOLN: 4.0000 mg | INTRAMUSCULAR | Status: DC | PRN

## 2018-02-14 MED ORDER — FENTANYL 2.5 MCG/ML BUPIVACAINE 1/10 % EPIDURAL INFUSION (WH - ANES): 14.0000 mL/h | INTRAMUSCULAR | Status: DC | PRN

## 2018-02-14 MED ORDER — SOD CITRATE-CITRIC ACID 500-334 MG/5ML PO SOLN: 30.0000 mL | ORAL | Status: DC | PRN

## 2018-02-14 MED ORDER — WITCH HAZEL-GLYCERIN EX PADS: 1.0000 "application " | MEDICATED_PAD | CUTANEOUS | Status: DC | PRN

## 2018-02-14 MED ORDER — ACETAMINOPHEN 325 MG PO TABS
650.0000 mg | ORAL_TABLET | ORAL | Status: DC | PRN
  Administered 2018-02-15 (×3): 650 mg via ORAL
  Filled 2018-02-14 (×3): qty 2

## 2018-02-14 MED ORDER — LIDOCAINE HCL (PF) 1 % IJ SOLN
30.0000 mL | INTRAMUSCULAR | Status: DC | PRN
  Administered 2018-02-14: 30 mL via SUBCUTANEOUS
  Filled 2018-02-14: qty 30

## 2018-02-14 MED ORDER — FENTANYL 2.5 MCG/ML BUPIVACAINE 1/10 % EPIDURAL INFUSION (WH - ANES)
INTRAMUSCULAR | Status: AC
  Filled 2018-02-14: qty 100

## 2018-02-14 MED ORDER — ALBUTEROL SULFATE (2.5 MG/3ML) 0.083% IN NEBU: 3.0000 mL | INHALATION_SOLUTION | Freq: Four times a day (QID) | RESPIRATORY_TRACT | Status: DC | PRN

## 2018-02-14 MED ORDER — OXYTOCIN BOLUS FROM INFUSION
500.0000 mL | Freq: Once | INTRAVENOUS | Status: AC
  Administered 2018-02-14: 500 mL via INTRAVENOUS

## 2018-02-14 MED ORDER — ONDANSETRON HCL 4 MG/2ML IJ SOLN: 4.0000 mg | Freq: Four times a day (QID) | INTRAMUSCULAR | Status: DC | PRN

## 2018-02-14 MED ORDER — PRENATAL MULTIVITAMIN CH
1.0000 | ORAL_TABLET | Freq: Every day | ORAL | Status: DC
  Administered 2018-02-15 – 2018-02-16 (×2): 1 via ORAL
  Filled 2018-02-14 (×2): qty 1

## 2018-02-14 NOTE — Discharge Instructions (Signed)
Braxton Hicks Contractions °Contractions of the uterus can occur throughout pregnancy, but they are not always a sign that you are in labor. You may have practice contractions called Braxton Hicks contractions. These false labor contractions are sometimes confused with true labor. °What are Braxton Hicks contractions? °Braxton Hicks contractions are tightening movements that occur in the muscles of the uterus before labor. Unlike true labor contractions, these contractions do not result in opening (dilation) and thinning of the cervix. Toward the end of pregnancy (32-34 weeks), Braxton Hicks contractions can happen more often and may become stronger. These contractions are sometimes difficult to tell apart from true labor because they can be very uncomfortable. You should not feel embarrassed if you go to the hospital with false labor. °Sometimes, the only way to tell if you are in true labor is for your health care provider to look for changes in the cervix. The health care provider will do a physical exam and may monitor your contractions. If you are not in true labor, the exam should show that your cervix is not dilating and your water has not broken. °If there are other health problems associated with your pregnancy, it is completely safe for you to be sent home with false labor. You may continue to have Braxton Hicks contractions until you go into true labor. °How to tell the difference between true labor and false labor °True labor °· Contractions last 30-70 seconds. °· Contractions become very regular. °· Discomfort is usually felt in the top of the uterus, and it spreads to the lower abdomen and low back. °· Contractions do not go away with walking. °· Contractions usually become more intense and increase in frequency. °· The cervix dilates and gets thinner. °False labor °· Contractions are usually shorter and not as strong as true labor contractions. °· Contractions are usually irregular. °· Contractions  are often felt in the front of the lower abdomen and in the groin. °· Contractions may go away when you walk around or change positions while lying down. °· Contractions get weaker and are shorter-lasting as time goes on. °· The cervix usually does not dilate or become thin. °Follow these instructions at home: °· Take over-the-counter and prescription medicines only as told by your health care provider. °· Keep up with your usual exercises and follow other instructions from your health care provider. °· Eat and drink lightly if you think you are going into labor. °· If Braxton Hicks contractions are making you uncomfortable: °? Change your position from lying down or resting to walking, or change from walking to resting. °? Sit and rest in a tub of warm water. °? Drink enough fluid to keep your urine pale yellow. Dehydration may cause these contractions. °? Do slow and deep breathing several times an hour. °· Keep all follow-up prenatal visits as told by your health care provider. This is important. °Contact a health care provider if: °· You have a fever. °· You have continuous pain in your abdomen. °Get help right away if: °· Your contractions become stronger, more regular, and closer together. °· You have fluid leaking or gushing from your vagina. °· You pass blood-tinged mucus (bloody show). °· You have bleeding from your vagina. °· You have low back pain that you never had before. °· You feel your baby’s head pushing down and causing pelvic pressure. °· Your baby is not moving inside you as much as it used to. °Summary °· Contractions that occur before labor are called Braxton   Hicks contractions, false labor, or practice contractions. °· Braxton Hicks contractions are usually shorter, weaker, farther apart, and less regular than true labor contractions. True labor contractions usually become progressively stronger and regular and they become more frequent. °· Manage discomfort from Braxton Hicks contractions by  changing position, resting in a warm bath, drinking plenty of water, or practicing deep breathing. °This information is not intended to replace advice given to you by your health care provider. Make sure you discuss any questions you have with your health care provider. °Document Released: 02/23/2017 Document Revised: 02/23/2017 Document Reviewed: 02/23/2017 °Elsevier Interactive Patient Education © 2018 [employer] ° °

## 2018-02-14 NOTE — MAU Note (Signed)
Pt presents to [facility] she was seen last night pt states ctx got really close at 4. No leaking good fetal movement. Bloody show.

## 2018-02-14 NOTE — MAU Note (Signed)
I have communicated with Dr. Primitivo GauzeFletcher and reviewed vital signs:  Vitals:   02/14/18 0127 02/14/18 0223  BP: 135/84 121/76  Pulse: 94 79  Resp: 16   Temp:      Vaginal exam:  Dilation: 3 Effacement (%): 70 Cervical Position: Middle Station: -2 Presentation: Vertex Exam by:: K. WeissRN,   Also reviewed contraction pattern and that non-stress test is reactive.  It has been documented that patient is contracting every 3-6 minutes with no cervical change over 1 1/2  hours not indicating active labor.  Patient denies any other complaints.  Based on this report provider has given order for discharge.  A discharge order and diagnosis entered by a provider.   Labor discharge instructions reviewed with patient.

## 2018-02-14 NOTE — MAU Note (Signed)
CTX 5 mins apart.  No LOF/VB.  Had her membranes stripped in the office, was 2.5 cm.  Hx of chtn.  Induction for Thursday night.  Reports good FM.

## 2018-02-14 NOTE — H&P (Addendum)
OBSTETRIC ADMISSION HISTORY AND PHYSICAL  Pamela Duncan is a 37 y.o. female [phone] with IUP at 6613w5d by early US presenting for labor. She reports +FMs, No LOF, no VB, no blurry vision, headaches or peripheral edema, and RUQ pain.  She plans on breast feeding. She request IUD for birth control. She received her prenatal care at [facility]  Dating: By early U/S --->  Estimated Date of Delivery: 02/16/18  Sono:    @[redacted]w[redacted]d , CWD, normal anatomy, cephalic presentation,  3107g, 59% EFW    Prenatal History/Complications:  Past Medical History: Past Medical History:  Diagnosis Date  . Abnormal Pap smear    colpo  . ADHD (attention deficit hyperactivity disorder)   . Anxiety   . Asthma   . Degenerative disc disease, lumbar   . Depression    PP depression with meds and counselor   . Hypertension    stopped taking medicine  . Insomnia   . Interstitial cystitis   . Overactive bladder   . Vaginal Pap smear, abnormal     Past Surgical History: Past Surgical History:  Procedure Laterality Date  . COLPOSCOPY  2013  . WISDOM TOOTH EXTRACTION      Obstetrical History: OB History    Gravida  5   Para  1   Term  1   Preterm  0   AB  3   Living  1     SAB  3   TAB  0   Ectopic  0   Multiple  0   Live Births  1           Social History: Social History   Socioeconomic History  . Marital status: Single    Spouse name: Not on file  . Number of children: Not on file  . Years of education: Not on file  . Highest education level: Not on file  Occupational History  . Not on file  Social Needs  . Financial resource strain: Not on file  . Food insecurity:    Worry: Not on file    Inability: Not on file  . Transportation needs:    Medical: Not on file    Non-medical: Not on file  Tobacco Use  . Smoking status: Current Every Day Smoker    Types: Cigars  . Smokeless tobacco: Never Used  . Tobacco comment: 3 cigars per day  Substance and Sexual Activity  .  Alcohol use: No  . Drug use: Yes    Types: Marijuana    Comment: chewable  . Sexual activity: Yes    Birth control/protection: None  Lifestyle  . Physical activity:    Days per week: Not on file    Minutes per session: Not on file  . Stress: Not on file  Relationships  . Social connections:    Talks on phone: Not on file    Gets together: Not on file    Attends religious service: Not on file    Active member of club or organization: Not on file    Attends meetings of clubs or organizations: Not on file    Relationship status: Not on file  Other Topics Concern  . Not on file  Social History Narrative  . Not on file    Family History: Family History  Problem Relation Age of Onset  . Heart disease Mother   . Lung cancer Father   . Other Neg Hx     Allergies: Allergies  Allergen Reactions  . Codeine Hives  .  Latex Rash    Medications Prior to Admission  Medication Sig Dispense Refill Last Dose  . albuterol (PROVENTIL HFA;VENTOLIN HFA) 108 (90 Base) MCG/ACT inhaler Inhale 2 puffs into the lungs every 6 (six) hours as needed for wheezing or shortness of breath. 1 Inhaler 2 prn  . cyclobenzaprine (FLEXERIL) 10 MG tablet Take 1 tablet (10 mg total) by mouth 3 (three) times daily as needed for muscle spasms. 30 tablet 2 02/13/2018 at Unknown time  . FLUoxetine (PROZAC) 20 MG tablet Take 20 mg by mouth daily.   02/13/2018 at Unknown time  . HydrOXYzine Pamoate (VISTARIL PO) Take 25 mg by mouth.    prn  . Prenatal Vit-Fe Fumarate-FA (PRENATAL MULTIVITAMIN) TABS tablet Take 1 tablet by mouth daily at 12 noon.   02/13/2018 at Unknown time  . zolpidem (AMBIEN) 5 MG tablet Take 5 mg by mouth at bedtime as needed for sleep.   02/13/2018 at Unknown time  . aspirin EC 81 MG tablet Take 1 tablet (81 mg total) by mouth daily. (Patient not taking: Reported on 01/30/2018) 30 tablet 9 Not Taking at Unknown time  . HYDROcodone-acetaminophen (NORCO/VICODIN) 5-325 MG tablet Take 1 tablet by mouth  every 6 (six) hours as needed for moderate pain. (Patient not taking: Reported on 02/14/2018) 30 tablet 0 Completed Course at Unknown time  . metoCLOPramide (REGLAN) 10 MG tablet Take 1 tablet (10 mg total) by mouth 3 (three) times daily before meals. (Patient not taking: Reported on 02/14/2018) 90 tablet 1 Not Taking at Unknown time     Review of Systems   All systems reviewed and negative except as stated in HPI  Blood pressure (!) 132/91, pulse (!) 102, temperature 98.1 F (36.7 C), temperature source Oral, resp. rate 19, last menstrual period 04/12/2017, SpO2 98 %, unknown if currently breastfeeding. General appearance: alert and cooperative Lungs: clear to auscultation bilaterally Heart: regular rate and rhythm Abdomen: soft, non-tender; bowel sounds normal Extremities: Homans sign is negative, no sign of DVT Presentation: cephalic Fetal monitoringBaseline: 150 bpm, Variability: Good {> 6 bpm), Accelerations: Reactive and Decelerations: Absent Uterine activityFrequency: Every 2-3 minutes Dilation: 7 Effacement (%): 90 Station: -2 Exam by:: Minus Liberty, RN    Prenatal labs: ABO, Rh: --/--/O NEG (04/24 1930) Antibody: PENDING (04/24 1930) Rubella: 4.28 (10/18 0914) RPR: Non Reactive (02/11 1038)  HBsAg: Negative (10/18 0914)  HIV: Non Reactive (02/11 1038)  GBS: Negative (04/09 1030)  Glucose tolerance test order is active, but no results Genetic screening  normal Anatomy US normal  Prenatal Transfer Tool  Maternal Diabetes: No Genetic Screening: Normal Maternal Ultrasounds/Referrals: Normal Fetal Ultrasounds or other Referrals:  None Maternal Substance Abuse:  No Significant Maternal Medications:  Meds include: Prozac Significant Maternal Lab Results: None  Results for orders placed or performed during the hospital encounter of 02/14/18 (from the past 24 hour(s))  Type and screen [facility] [place]   Collection Time: 02/14/18  7:30 PM  Result Value  Ref Range   ABO/RH(D) O NEG    Antibody Screen PENDING    Sample Expiration      02/17/2018 Performed at [facility], [address], [place], [place] [address]     Patient Active Problem List   Diagnosis Date Noted  . Normal labor 02/14/2018  . Obesity during pregnancy 02/13/2018  . Tobacco smoking affecting pregnancy in second trimester 10/31/2017  . Insomnia 10/31/2017  . Nausea 10/31/2017  . Constipation in pregnancy in second trimester 10/31/2017  . Supervision of other normal  pregnancy, antepartum 08/10/2017  . Rh negative state in antepartum period 08/10/2017  . Anxiety disorder affecting pregnancy, antepartum 08/10/2017  . Depression 08/10/2017  . Overactive bladder 08/10/2017  . Hypertension in pregnancy, antepartum 08/10/2017  . Advanced maternal age in multigravida 08/10/2017  . Benign essential hypertension 08/10/2017    Assessment/Plan:  Pamela Duncan is a 37 y.o. [id number] at [redacted]w[redacted]d here for labor. Patient will be admitted with delivery imminent. NO augmentation necessary, gbs negative. Does take prozac for anxiety.  #Labor: imminent #Pain: Requests epidurla #FWB: Cat 2 #ID:  na #MOF: breast and bottle #MOC:iud #Circ:  Yes, in hospital  Myrene Buddy, MD  02/14/2018, 8:49 PM   I confirm that I have verified the information documented in the resident's note and that I have also personally reperformed the physical exam and all medical decision making activities.   Thressa Sheller 10:50 PM 02/14/18

## 2018-02-15 LAB — RPR: RPR: NONREACTIVE

## 2018-02-15 MED ORDER — CYCLOBENZAPRINE HCL 5 MG PO TABS
7.5000 mg | ORAL_TABLET | Freq: Three times a day (TID) | ORAL | Status: DC | PRN
  Administered 2018-02-15 (×2): 7.5 mg via ORAL
  Filled 2018-02-15 (×3): qty 1.5

## 2018-02-15 MED ORDER — HYDROCODONE-ACETAMINOPHEN 5-325 MG PO TABS
1.0000 | ORAL_TABLET | Freq: Once | ORAL | Status: AC
  Administered 2018-02-15: 1 via ORAL
  Filled 2018-02-15: qty 1

## 2018-02-15 MED ORDER — FENTANYL CITRATE (PF) 100 MCG/2ML IJ SOLN
100.0000 ug | Freq: Once | INTRAMUSCULAR | Status: AC
  Administered 2018-02-15: 100 ug via INTRAVENOUS
  Filled 2018-02-15: qty 2

## 2018-02-15 MED ORDER — RHO D IMMUNE GLOBULIN 1500 UNIT/2ML IJ SOSY
300.0000 ug | PREFILLED_SYRINGE | Freq: Once | INTRAMUSCULAR | Status: AC
  Administered 2018-02-15: 300 ug via INTRAVENOUS
  Filled 2018-02-15: qty 2

## 2018-02-15 NOTE — Progress Notes (Signed)
Spoke with Dr. Orvan Falconerampbell regarding pt's c/o abdominal cramping 7/10. Pt requesting medication to help her rest. Earlier in day had one time dose of Fentanyl (currently has no IV access) and flexeril. Pt reports that flexeril seems to make her pain worse. Dr. Orvan Falconerampbell to come to assess pt.

## 2018-02-15 NOTE — Progress Notes (Addendum)
Post Partum Day 1 Subjective: up ad lib, voiding, tolerating PO and + flatus. Having bad uterine cramping, will give one dose flexeril 7.5mg .  Objective: Blood pressure 122/81, pulse 74, temperature 97.8 F (36.6 C), temperature source Oral, resp. rate 18, height 5\' 5"  (1.651 m), weight 99.1 kg (218 lb 8 oz), last menstrual period 04/12/2017, SpO2 100 %, unknown if currently breastfeeding.  Physical Exam:  General: alert and cooperative Lochia: appropriate Uterine Fundus: firm Incision: na DVT Evaluation: No evidence of DVT seen on physical exam. Negative Homan's sign.  Recent Labs    02/14/18 1930  HGB 11.9*  HCT 34.2*    Assessment/Plan: Plan for discharge tomorrow, Breastfeeding, Lactation consult, Social Work consult, Circumcision prior to discharge and Contraception IUD   LOS: 1 day   Myrene BuddyJacob Fletcher 02/15/2018, 9:09 AM   Flexeril given with no relief of abdominal cramping and back pain. Patient using heat packs with no relief. Reports pain 8/10. Will order IV pain medication for relief. Plan for discharge tomorrow- Rhogam needed prior to discharge.  Sharyon CableVeronica C Aren Pryde, CNM 02/15/18, 12:00 PM

## 2018-02-15 NOTE — Progress Notes (Signed)
  Was called by nursing secondary to patient having persistent abdominal pain. Described as crampy pain in a band distribution in bilateral lower quadrants. Uterus is firm. No rebound or guarding. Mild tenderness to palpation.   Patient receiving ibuprofen, with last dose about 4 hrs ago  Will give one time dose of hydrocodone-acetaminophen 5-325. May try IM Toradol if oral not effective around midnight.

## 2018-02-15 NOTE — Lactation Note (Signed)
This note was copied from a baby's chart. Lactation Consultation Note Baby 5 hrs old. Baby sleeping. Mom states baby BF well. Mom's 1st child now 745 yrs old she pumped and bottle fed d/t suck was to strong and it hurt. Mom doesn't remember how long she pumped and bottle fed. Mom states this baby isn't hurting when latches. Mom has colostrum. Hand expressed from everted nipples. Large pendulous breast. Skin intact. Denies painful latching.  Mom encouraged to feed baby 8-12 times/24 hours and with feeding cues. Newborn behavior, STS, I&O, discussed. Encouraged to assess breast for transfer and call for assistance if needed. [facility] brochure given w/resources, support groups and LC services.  Patient Name: Pamela Duncan [id number]'XToday's Date: 02/15/2018 Reason for consult: Initial assessment   Maternal Data Has patient been taught Hand Expression?: Yes Does the patient have breastfeeding experience prior to this delivery?: Yes  Feeding    LATCH Score       Type of Nipple: Everted at rest and after stimulation  Comfort (Breast/Nipple): Soft / non-tender        Interventions Interventions: Breast feeding basics reviewed;Support pillows;Position options;Skin to skin;Breast massage;Coconut oil;Hand express;Breast compression  Lactation Tools Discussed/Used WIC Program: No   Consult Status Consult Status: Follow-up Date: 02/16/18 Follow-up type: In-patient    Pamela Duncan, Diamond NickelLAURA G 02/15/2018, 2:21 AM

## 2018-02-16 ENCOUNTER — Inpatient Hospital Stay (HOSPITAL_COMMUNITY): Admission: RE | Admit: 2018-02-16 | Payer: Medicare HMO | Source: Ambulatory Visit

## 2018-02-16 LAB — RH IG WORKUP (INCLUDES ABO/RH)
ABO/RH(D): O NEG
Fetal Screen: NEGATIVE
GESTATIONAL AGE(WKS): 39.5
UNIT DIVISION: 0

## 2018-02-16 MED ORDER — HYDROCODONE-ACETAMINOPHEN 5-325 MG PO TABS
1.0000 | ORAL_TABLET | Freq: Once | ORAL | Status: AC
  Administered 2018-02-16: 1 via ORAL
  Filled 2018-02-16: qty 1

## 2018-02-16 MED ORDER — IBUPROFEN 600 MG PO TABS: 600.0000 mg | ORAL_TABLET | Freq: Four times a day (QID) | ORAL | 0 refills | Status: AC

## 2018-02-16 NOTE — Clinical Social Work Maternal (Signed)
CLINICAL SOCIAL WORK MATERNAL/CHILD NOTE  Patient Details  Name: Pamela Duncan MRN: [id number] Date of Birth: 08-18-1981  Date:  02/16/2018  Clinical Social Worker Initiating Note:  Laurey Arrow Date/Time: Initiated:  02/16/18/1203     Child's Name:  Pamela Duncan   Biological Parents:  Mother, Father   Need for Interpreter:  None   Reason for Referral:  Behavioral Health Concerns, Current Substance Use/Substance Use During Pregnancy    Address:  [place] [address]    Phone number:  [phone] (home)     Additional phone number:   Household Members/Support Persons (HM/SP):   Household Member/Support Person 1, Household Member/Support Person 2   HM/SP Name Relationship DOB or Age  HM/SP -1 Charolette Child FOB 09/21/1979  HM/SP -2 Ferne Coe son 04/06/2013  HM/SP -3        HM/SP -4        HM/SP -5        HM/SP -6        HM/SP -7        HM/SP -8          Natural Supports (not living in the home):  Other (Comment)(Per MOB, FOB's family will be MOB's main source of support. )   Professional Supports: Therapist(MOB is an establish patient with [facility])   Employment: Disabled   Type of Work:     Education:  Forensic psychologist   Homebound arranged:    Museum/gallery curator Resources:  [employer] , Multimedia programmer   Other Resources:      Cultural/Religious Considerations Which May Impact Care:  Per [employer] Presenter, broadcasting, MOB is Educational psychologist.  Strengths:  Ability to meet basic needs , Compliance with medical plan , Home prepared for child , Psychotropic Medications, Understanding of illness   Psychotropic Medications:  Prozac      Pediatrician:       Pediatrician List:   [place]      Pediatrician Fax Number:    Risk Factors/Current Problems:  Mental Health Concerns    Cognitive State:  Able to Concentrate , Alert , Insightful , Goal  Oriented , Linear Thinking    Mood/Affect:  Bright , Comfortable , Happy , Relaxed , Interested    CSW Assessment: CSW met with MOB in room 119 to complete an assessment for SA hx and MH hx.  When CSW arrived, MOB appeared dressed and infant was asleep in bassinet.  MOB was polite, forthcoming, and interested in meeting with CSW.  CSW asked about MOB' [place] hx and MOB openly shared a hx of anxiety and depression since MOB was around 37 years of age.  MOB reported MOB's symptoms have been managed with medication and MOB is currently taking Prozac.  MOB also shared that MOB is an established patient of Dr. Jessy Oto with [facility]; MOB's next appointment is scheduled in 3 weeks.   CSW provided education regarding the baby blues period vs. perinatal mood disorders, discussed treatment and gave resources for mental health follow up if concerns arise.  CSW recommends self-evaluation during the postpartum time period using the New Mom Checklist from Postpartum Progress and encouraged MOB to contact a medical professional if symptoms are noted at any time.  MOB acknowledged PPD symptoms with MOB's oldest son and attributed her symptoms to also experiencing the loss of her father. CSW assessed for  safety and MOB denied SI, HI, and DV.  MOB appeared to have insight and awareness and expressed with confidence that MOB will seek help if help is needed.  MOB also communicated a strong support team that primarily consisted of FOB's family.  CSW asked about MOB's SA hx.  MOB disclosed that use of CBDs prior to MOB's pregnancy.  MOB denied the use of all illicit substance and appeared surprised that CSW was assessing for SA.  MOB made MOB aware of hospital's policy regarding SA use and MOB was understanding.  CSW informed MOB that CSW was monitoring infants UDS and CDS and will make a report to [place] if either are positive without an explanation; MOB did not appear concerned.   MOB reported  having all essential items needed for infant and feeling prepared to parent.   CSW Plan/Description:  No Further Intervention Required/No Barriers to Discharge, Perinatal Mood and Anxiety Disorder (PMADs) Education, [place], CSW Will Continue to Monitor Umbilical Cord Tissue Drug Screen Results and Make Report if Warranted, Sudden Infant Death Syndrome (SIDS) Education   Laurey Arrow, MSW, LCSW Clinical Social Work [phone]  Dimple Nanas, LCSW 02/16/2018, 12:10 PM

## 2018-02-16 NOTE — Lactation Note (Signed)
This note was copied from a baby's chart. Lactation Consultation Note Baby 5029 hrs old. Mom BF when LC entered. Mom stated baby cluster feeding. Stated baby has been spitting a lot today. Mom stated she had a lot of visitors, and gave some formula. Discussed supply and demand. Mom stated when she gets home and her milk comes in she will be pumping and freezing some milk. Discussed engorgement, filling, baby I&O, and wearing supportive bra. Mom has very large breast, hasn't worn a bra since been in hospital.  Mom feels comfortable about going home w/baby and BF.   Patient Name: Pamela Duncan ZOXWR'UToday's Date: 02/16/2018 Reason for consult: Follow-up assessment   Maternal Data    Feeding Feeding Type: Breast Fed Length of feed: 15 min(still BF)  LATCH Score Latch: Grasps breast easily, tongue down, lips flanged, rhythmical sucking.  Audible Swallowing: Spontaneous and intermittent  Type of Nipple: Everted at rest and after stimulation  Comfort (Breast/Nipple): Soft / non-tender  Hold (Positioning): No assistance needed to correctly position infant at breast.  LATCH Score: 10  Interventions    Lactation Tools Discussed/Used     Consult Status Consult Status: Complete Date: 02/16/18    Charyl DancerCARVER, Saahas Hidrogo G 02/16/2018, 2:28 AM

## 2018-02-16 NOTE — Discharge Instructions (Signed)
Places to have your son circumcised:                                                                      °[facility]        [phone]   $480 while you are in hospital         °[facility]                [phone]   $244 by 4 wks             °[facility]               [phone]   $175 by 2 wks             °[facility]                      [phone]   $250 by 7 days °[facility]                     [phone]   $269 by 4 wks ° °These prices sometimes change but are roughly what you can expect to pay. Please call and confirm pricing.  ° °Circumcision is considered an elective/non-medically necessary procedure. There are many reasons parents decide to have their sons circumsized. During the first year of life circumcised males have a reduced risk of urinary tract infections but after this year the rates between circumcised males and uncircumcised males are the same.  It is safe to have your son circumcised outside of the hospital and the places above perform them regularly.  ° °Deciding about Circumcision in Baby Boys  °(Up-to-date The Basics) ° °What is circumcision?  ° °Circumcision is a surgery that removes the skin that covers the tip of the penis, called the "foreskin" Circumcision is usually done when a boy is between 1 and 10 days old. In the [place], circumcision is common. In some other countries, fewer boys are circumcised. Circumcision is a common tradition in some religions. ° °Should I have my baby boy circumcised?  ° °There is no easy answer. Circumcision has some benefits. But it also has risks. After talking with your doctor, you will have to decide for yourself what is right for your family. ° °What are the benefits of circumcision?  ° °Circumcised boys seem to have slightly lower rates of: °?Urinary tract infections °?Swelling of the opening at the tip of the penis °Circumcised men seem to have slightly lower rates of: °?Urinary tract infections °?Swelling of the opening at the tip of the penis °?Penis  cancer °?HIV and other infections that you catch during sex °?Cervical cancer in the women they have sex with °Even so, in the [place], the risks of these problems are small - even in boys and men who have not been circumcised. Plus, boys and men who are not circumcised can reduce these extra risks by: °?Cleaning their penis well °?Using condoms during sex ° °What are the risks of circumcision? ° °Risks include: °?Bleeding or infection from the surgery °?Damage to or amputation of the penis °?A chance that the doctor will cut off too much or not enough of the foreskin °?A chance that sex won't feel as good later in life °Only about 1 out of every 200 circumcisions leads to problems. There is also a chance that your health insurance won't pay for circumcision. ° °How is circumcision done in baby boys? ° °First, the baby gets medicine for pain relief. This might be a cream on the skin or a shot into the base of the penis. Next, the doctor cleans the baby's penis well. Then he or she uses special tools to cut off the foreskin. Finally, the doctor wraps a bandage (called gauze) around the baby's penis. °If you have your baby circumcised, his doctor or nurse will give you instructions on how to care for him after the surgery. It is important that you follow those instructions carefully. ° ° ° °Postpartum Care After Vaginal Delivery °The period of time right after you deliver your newborn is called the postpartum period. °What kind of medical care will I receive? °· You may continue to receive fluids and medicines through an IV tube inserted into one of your veins. °· If an incision was made near your vagina (episiotomy)   or if you had some vaginal tearing during delivery, cold compresses may be placed on your episiotomy or your tear. This helps to reduce pain and swelling. °· You may be given a squirt bottle to use when you go to the bathroom. You may use this until you are comfortable wiping as usual. To use the  squirt bottle, follow these steps: °? Before you urinate, fill the squirt bottle with warm water. Do not use hot water. °? After you urinate, while you are sitting on the toilet, use the squirt bottle to rinse the area around your urethra and vaginal opening. This rinses away any urine and blood. °? You may do this instead of wiping. As you start healing, you may use the squirt bottle before wiping yourself. Make sure to wipe gently. °? Fill the squirt bottle with clean water every time you use the bathroom. °· You will be given sanitary pads to wear. °How can I expect to feel? °· You may not feel the need to urinate for several hours after delivery. °· You will have some soreness and pain in your abdomen and vagina. °· If you are breastfeeding, you may have uterine contractions every time you breastfeed for up to several weeks postpartum. Uterine contractions help your uterus return to its normal size. °· It is normal to have vaginal bleeding (lochia) after delivery. The amount and appearance of lochia is often similar to a menstrual period in the first week after delivery. It will gradually decrease over the next few weeks to a dry, yellow-brown discharge. For most women, lochia stops completely by 6-8 weeks after delivery. Vaginal bleeding can vary from woman to woman. °· Within the first few days after delivery, you may have breast engorgement. This is when your breasts feel heavy, full, and uncomfortable. Your breasts may also throb and feel hard, tightly stretched, warm, and tender. After this occurs, you may have milk leaking from your breasts. Your health care provider can help you relieve discomfort due to breast engorgement. Breast engorgement should go away within a few days. °· You may feel more sad or worried than normal due to hormonal changes after delivery. These feelings should not last more than a few days. If these feelings do not go away after several days, speak with your health care  provider. °How should I care for myself? °· Tell your health care provider if you have pain or discomfort. °· Drink enough water to keep your urine clear or pale yellow. °· Wash your hands thoroughly with soap and water for at least 20 seconds after changing your sanitary pads, after using the toilet, and before holding or feeding your baby. °· If you are not breastfeeding, avoid touching your breasts a lot. Doing this can make your breasts produce more milk. °· If you become weak or lightheaded, or you feel like you might faint, ask for help before: °? Getting out of bed. °? Showering. °· Change your sanitary pads frequently. Watch for any changes in your flow, such as a sudden increase in volume, a change in color, the passing of large blood clots. If you pass a blood clot from your vagina, save it to show to your health care provider. Do not flush blood clots down the toilet without having your health care provider look at them. °· Make sure that all your vaccinations are up to date. This can help protect you and your baby from getting certain diseases. You may need to have immunizations done before you   leave the hospital. °· If desired, talk with your health care provider about methods of family planning or birth control (contraception). °How can I start bonding with my baby? °Spending as much time as possible with your baby is very important. During this time, you and your baby can get to know each other and develop a bond. Having your baby stay with you in your room (rooming in) can give you time to get to know your baby. Rooming in can also help you become comfortable caring for your baby. Breastfeeding can also help you bond with your baby. °How can I plan for returning home with my baby? °· Make sure that you have a car seat installed in your vehicle. °? Your car seat should be checked by a certified car seat installer to make sure that it is installed safely. °? Make sure that your baby fits into the car  seat safely. °· Ask your health care provider any questions you have about caring for yourself or your baby. Make sure that you are able to contact your health care provider with any questions after leaving the hospital. °This information is not intended to replace advice given to you by your health care provider. Make sure you discuss any questions you have with your health care provider. °Document Released: 08/07/2007 Document Revised: 03/14/2016 Document Reviewed: 09/14/2015 °Elsevier Interactive Patient Education © 2018 Elsevier Inc. ° °

## 2018-02-16 NOTE — Progress Notes (Signed)
Faculty Practice OB/GYN Attending Note  Patient's infant was taken to nursery for circumcision but was noted to have multiple emesis episodes during which he was dusky, had difficulty breathing and unable to lie flat.  Circumcision was cancelled, patient and her husband were informed.  They will proceed with outpatient circumcision later; information given to them about outpatient circumcision facilities and prices.  Patient to be discharged to home as planned.   Jaynie CollinsUGONNA  Travanti Mcmanus, MD, FACOG Obstetrician & Gynecologist, [facility], [facility]

## 2018-02-16 NOTE — Progress Notes (Signed)
Called Dr. Orvan Falconerampbell. Pt requesting another vicodin to be taken again when appropriate for pain control. Dr. Orvan Falconerampbell to place order.

## 2018-02-16 NOTE — Progress Notes (Signed)
Attending Circumcision Counseling Progress Note  Patient desires circumcision for her female infant.  Circumcision procedure details discussed, risks and benefits of procedure were also discussed.  These include but are not limited to: Benefits of circumcision in men include reduction in the rates of urinary tract infection (UTI), penile cancer, some sexually transmitted infections, penile inflammatory and retractile disorders, as well as easier hygiene.  Risks include bleeding , infection, injury of glans which may lead to penile deformity or urinary tract issues, unsatisfactory cosmetic appearance and other potential complications related to the procedure.  It was emphasized that this is an elective procedure.  Patient wants to proceed with circumcision; written informed consent obtained.  Will do circumcision soon, routine circumcision and post circumcision care ordered for the infant.  Jaynie CollinsUGONNA  ANYANWU, M.D. 02/16/2018 3:07 PM

## 2018-02-16 NOTE — Discharge Summary (Signed)
OB Discharge Summary     Patient Name: Pamela Duncan DOB: May 09, 1981 MRN: [id number]  Date of admission: 02/14/2018 Delivering MD: Thressa Sheller D   Date of discharge: 02/16/2018  Admitting diagnosis: 39WKS CTX Intrauterine pregnancy: [redacted]w[redacted]d     Secondary diagnosis:  Active Problems:   Normal labor  Additional problems: Gestational hypertension, anxiety, Rh negative     Discharge diagnosis: Term Pregnancy Delivered                                                                                                Post partum procedures:rhogam  Augmentation: none  Complications: None  Hospital course:  Onset of Labor With Vaginal Delivery     37 y.o. yo [id number] at [redacted]w[redacted]d was admitted in Latent Labor on 02/14/2018. Patient had an uncomplicated labor course as follows:  Membrane Rupture Time/Date: 9:05 PM ,02/14/2018   Intrapartum Procedures: Episiotomy: None [1]                                         Lacerations:  2nd degree [3]  Patient had a delivery of a Viable infant. 02/14/2018  Information for the patient's newborn:  [place], [place] [[id number]]  Delivery Method: Vaginal, Spontaneous(Filed from Delivery Summary)    Pateint had an uncomplicated postpartum course.  She is ambulating, tolerating a regular diet, passing flatus, and urinating well.   Blood pressures were well-controlled at the time of discharge. No headaches, blurry vision, chest pain, palpitations, or shortness of breath. No significant edema. Patient is discharged home in stable condition on 02/16/18.   Physical exam  Vitals:   02/15/18 0015 02/15/18 0525 02/15/18 1728 02/16/18 0606  BP: (!) 115/6 122/81 127/70 (!) 119/59  Pulse: 87 74 88 68  Resp: 18 18 18 20   Temp: 98 F (36.7 C) 97.8 F (36.6 C) 98.1 F (36.7 C) 97.7 F (36.5 C)  TempSrc:  Oral Oral Oral  SpO2: 100% 100% 98%   Weight:      Height:       General: alert, cooperative and no distress Lochia: appropriate Uterine Fundus:  firm Incision: N/A DVT Evaluation: No evidence of DVT seen on physical exam. Labs: Lab Results  Component Value Date   WBC 16.8 (H) 02/14/2018   HGB 11.9 (L) 02/14/2018   HCT 34.2 (L) 02/14/2018   MCV 90.2 02/14/2018   PLT 210 02/14/2018   CMP Latest Ref Rng & Units 02/14/2018  Glucose 65 - 99 mg/dL 99  BUN 6 - 20 mg/dL 10  Creatinine 1.30 - 8.65 mg/dL 7.84  Sodium 696 - 295 mmol/L 137  Potassium 3.5 - 5.1 mmol/L 4.1  Chloride 101 - 111 mmol/L 106  CO2 22 - 32 mmol/L 17(L)  Calcium 8.9 - 10.3 mg/dL 9.1  Total Protein 6.5 - 8.1 g/dL 6.7  Total Bilirubin 0.3 - 1.2 mg/dL 0.6  Alkaline Phos 38 - 126 U/L 235(H)  AST 15 - 41 U/L 25  ALT 14 - 54 U/L 20    Discharge  instruction: per After Visit Summary and "Baby and Me Booklet".  After visit meds:  Allergies as of 02/16/2018      Reactions   Codeine Hives   Latex Rash      Medication List    STOP taking these medications   aspirin EC 81 MG tablet   HYDROcodone-acetaminophen 5-325 MG tablet Commonly known as:  NORCO/VICODIN   metoCLOPramide 10 MG tablet Commonly known as:  REGLAN     TAKE these medications   albuterol 108 (90 Base) MCG/ACT inhaler Commonly known as:  PROVENTIL HFA;VENTOLIN HFA Inhale 2 puffs into the lungs every 6 (six) hours as needed for wheezing or shortness of breath.   cyclobenzaprine 10 MG tablet Commonly known as:  FLEXERIL Take 1 tablet (10 mg total) by mouth 3 (three) times daily as needed for muscle spasms.   FLUoxetine 20 MG tablet Commonly known as:  PROZAC Take 20 mg by mouth daily.   ibuprofen 600 MG tablet Commonly known as:  ADVIL,MOTRIN Take 1 tablet (600 mg total) by mouth every 6 (six) hours.   prenatal multivitamin Tabs tablet Take 1 tablet by mouth daily at 12 noon.   VISTARIL PO Take 25 mg by mouth.   zolpidem 5 MG tablet Commonly known as:  AMBIEN Take 5 mg by mouth at bedtime as needed for sleep.       Diet: routine diet  Activity: Advance as tolerated.  Pelvic rest for 6 weeks.   Outpatient follow up:1 week for blood pressure evaluation Follow up Appt:No future appointments. Follow up Visit:No follow-ups on file.  Postpartum contraception: IUD Mirena  Newborn Data: Live born female  Birth Weight: 7 lb 0.3 oz (3184 g) APGAR: 7, 9  Newborn Delivery   Birth date/time:  02/14/2018 21:07:00 Delivery type:  Vaginal, Spontaneous     Baby Feeding: Breast Disposition:home with mother   02/16/2018 Gorden HarmsMegan Michelle Wnek, MD

## 2018-02-17 ENCOUNTER — Ambulatory Visit: Payer: Self-pay

## 2018-02-17 NOTE — Lactation Note (Signed)
This note was copied from a baby's chart. Lactation Consultation Note  Patient Name: Pamela Duncan Date: 02/17/2018    [facility] Visit Prior to Discharge:   G5P2 mother whose infant is now 50 hours old.  Infant held overnight due to a dusky episode prior to circumcision yesterday.  He will be circumcised this a.m. And discharge to follow.  Mother has been primarily bottle feeding but states she wants to breast/bottle feed at home.  She has not pumped with a DEBP while at the hospital but has a manual pump in the room and pumped this a.m. due to breast fullness and obtained some milk.  She feels heavier and fuller. She states that she does not want to put the baby to breast.  Her plan after discharge will be to pump with a DEBP every 3 hours at home, especially now that the breasts have not been stimulated.  Discussed breast massage and hand expression.  Mother's nipples are short so shells were given with instructions for use.  Even though she states that she does not want to put baby to breast she asked for a NS.  Apparently she became very sore with her first child and used one for a couple of months with success.  NS fitted and mother did a return demonstration on how to properly apply to breast.  Engorgement prevention/treatment reviewed.  Mom made aware of O/P services, breastfeeding support groups, community resources, and our phone # for post-discharge questions.  FOB and son present at bedside.                   Kirstie Larsen R Aloise Copus 02/17/2018, 9:12 AM

## 2018-02-20 ENCOUNTER — Other Ambulatory Visit: Payer: Medicare HMO

## 2018-02-20 ENCOUNTER — Encounter: Payer: Medicare HMO | Admitting: Obstetrics and Gynecology

## 2018-12-31 ENCOUNTER — Other Ambulatory Visit: Payer: Self-pay | Admitting: Orthopedic Surgery

## 2018-12-31 DIAGNOSIS — M545 Low back pain: Principal | ICD-10-CM

## 2018-12-31 DIAGNOSIS — G8929 Other chronic pain: Secondary | ICD-10-CM

## 2019-02-15 IMAGING — US US MFM OB DETAIL+14 WK
1 series · 14 of 28 positions shown · non-contrast
Comparison: none

[Series 1: us mfm ob detail+14 wk · 80 acquisitions, 14 frames shown]
[im 3/80]
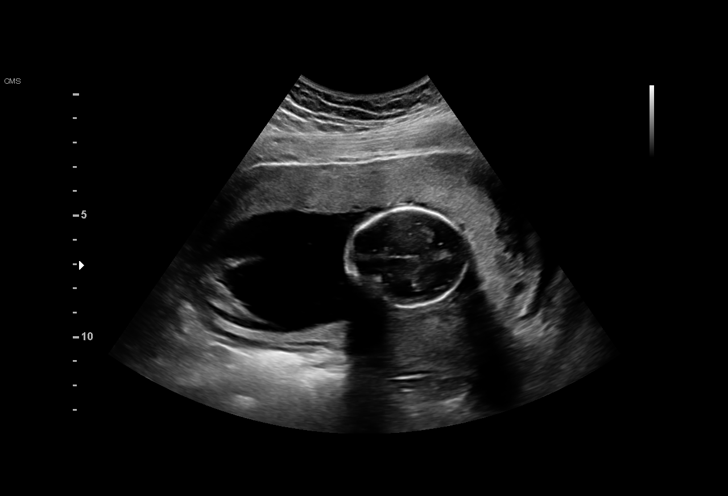
[im 9/80]
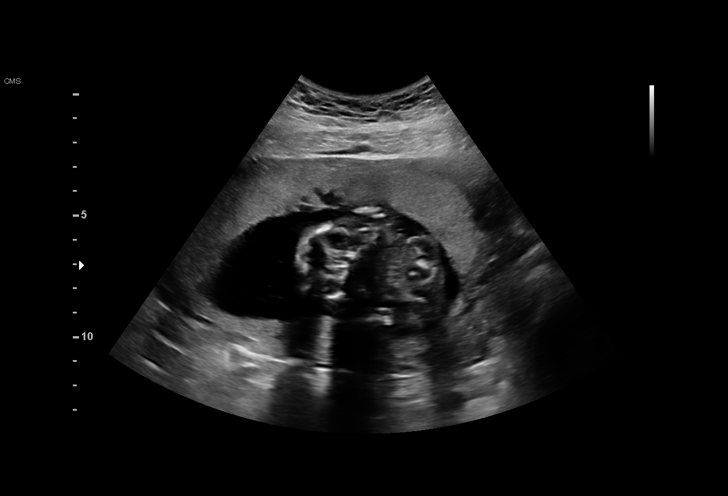
[im 15/80]
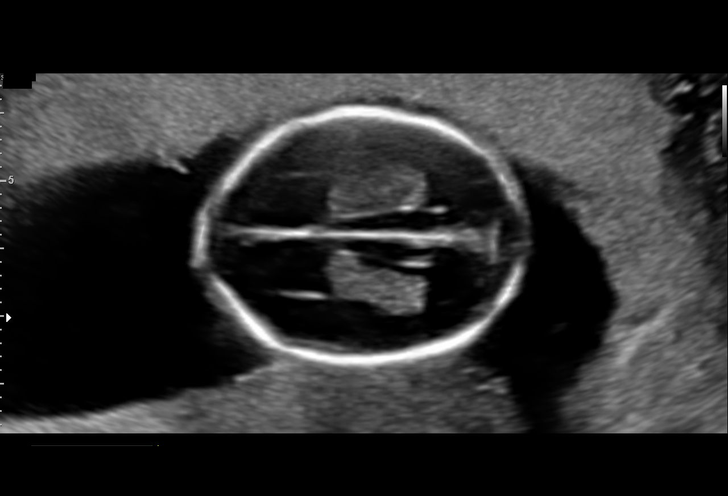
[im 21/80]
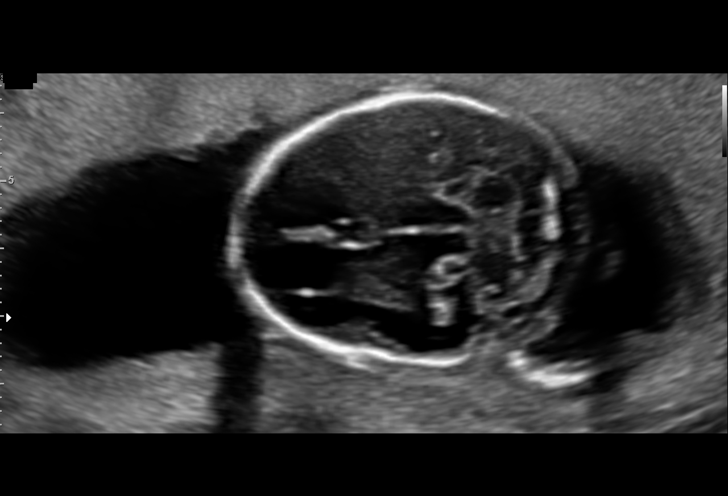
[im 27/80]
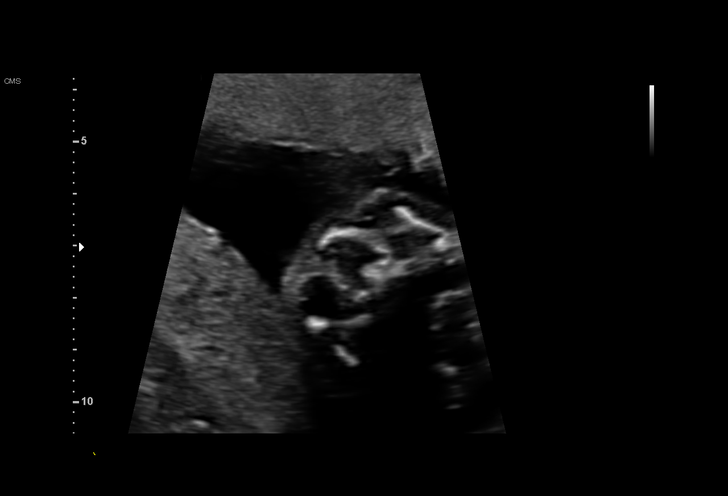
[im 33/80]
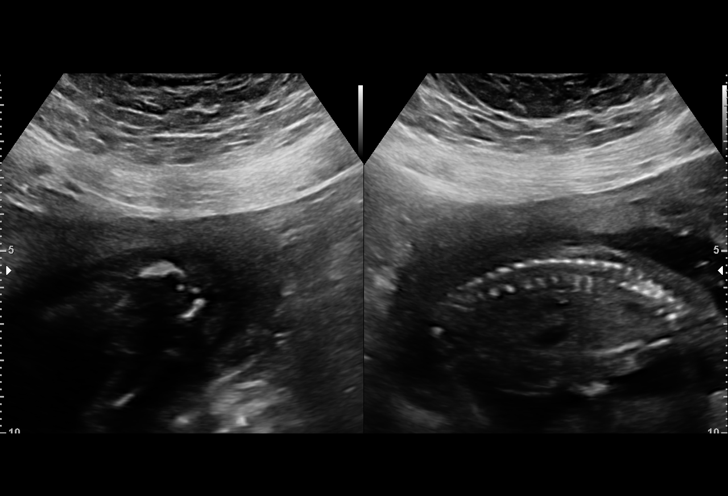
[im 39/80]
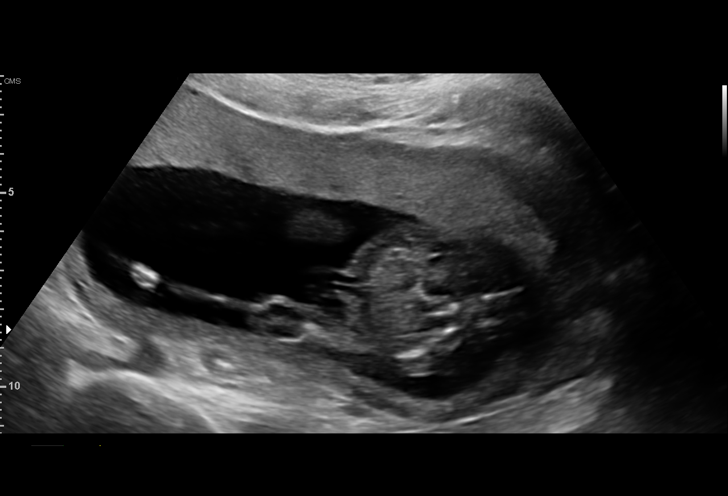
[im 44/80]
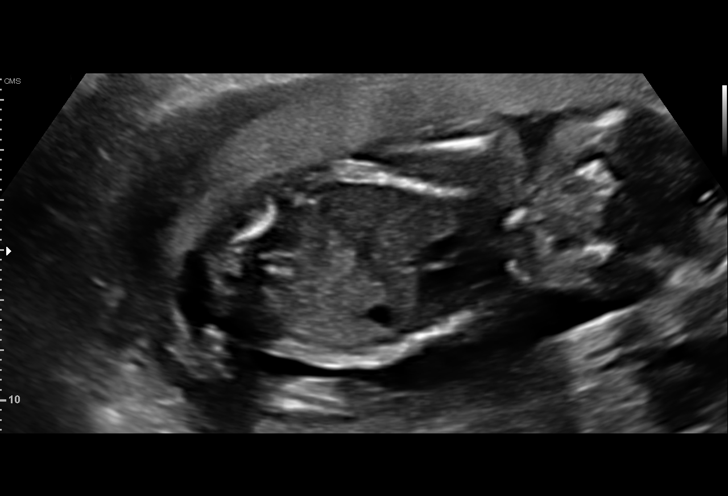
[im 50/80]
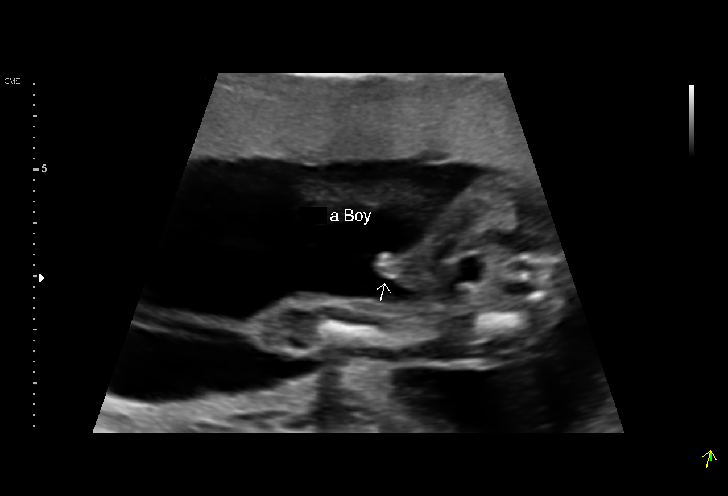
[im 56/80]
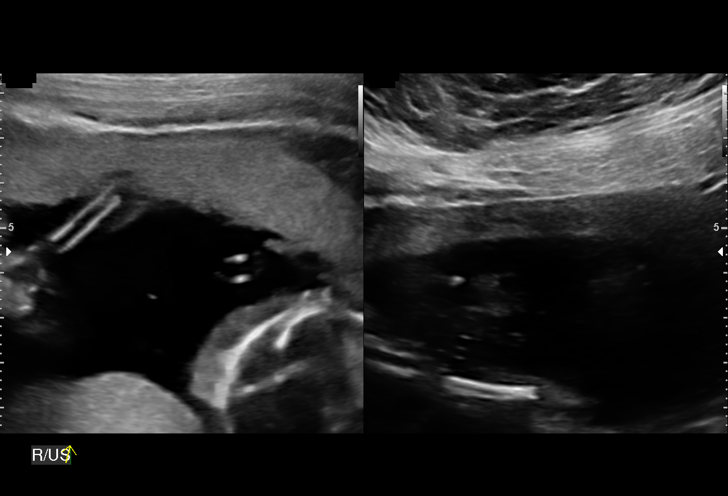
[im 62/80]
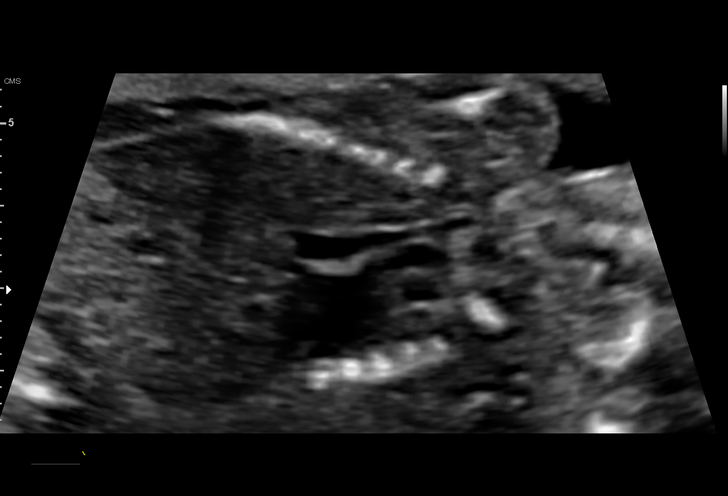
[im 68/80]
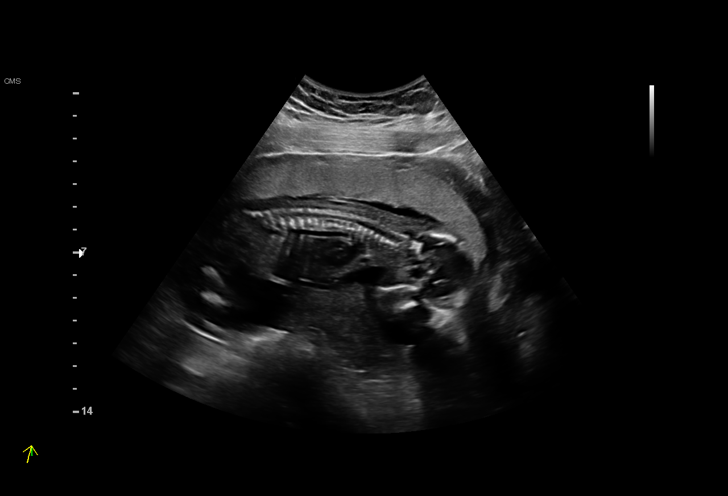
[im 74/80]
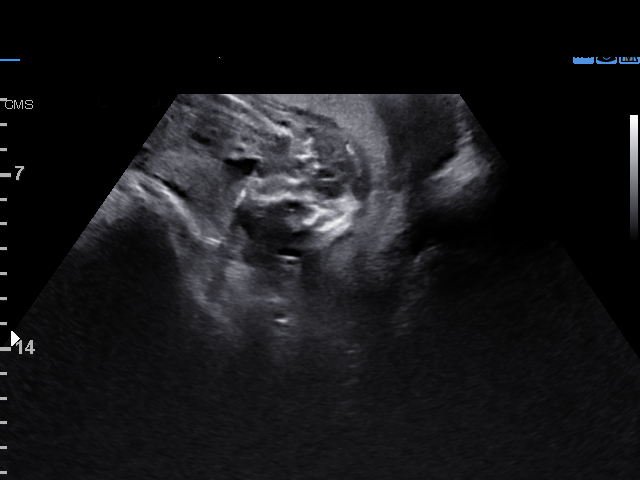
[im 80/80]
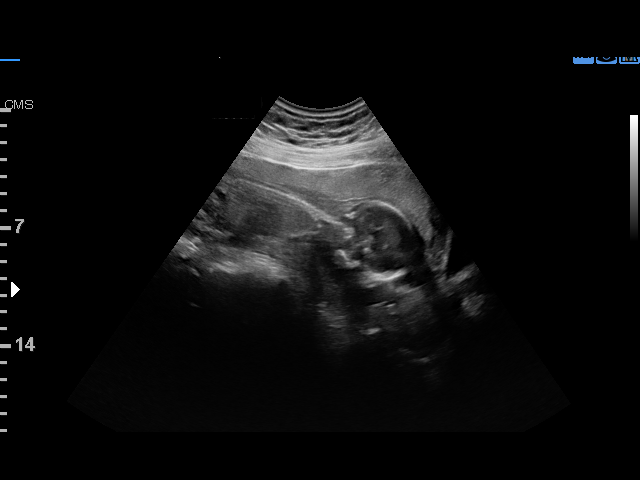

[14 of 28 positions shown; findings below may reference images not displayed]

1  JOSEL FAUST            [id number]      [id number]     [id number]
Indications

17 weeks gestation of pregnancy
Encounter for fetal anatomic survey
Advanced maternal age multigravida 35+,
second trimester (low risk Panorama)
Hypertension - Chronic/Pre-existing
OB History

Gravidity:    5         Term:   1        Prem:   0         SAB:   3
TOP:          0       Ectopic:  0        Living: 1
Fetal Evaluation

Num Of Fetuses:     1
Fetal Heart         155
Rate(bpm):
Cardiac Activity:   Observed
Presentation:       Cephalic
Placenta:           Anterior, above cervical os
P. Cord Insertion:  Visualized

Amniotic Fluid
AFI FV:      Subjectively within normal limits

Largest Pocket(cm)
5.0
Biometry

BPD:      38.2  mm     G. Age:  17w 5d         47  %    CI:         69.23  %    70 - 86
FL/HC:       19.3  %    15.8 - 18
HC:      146.6  mm     G. Age:  17w 6d         47  %    HC/AC:       1.12       1.07 -
AC:      130.9  mm     G. Age:  18w 4d         77  %    FL/BPD:      74.1  %
FL:       28.3  mm     G. Age:  18w 5d         78  %    FL/AC:       21.6  %    20 - 24
HUM:      22.5  mm     G. Age:  16w 6d         32  %
CER:      17.9  mm     G. Age:  17w 6d         53  %
NFT:       4.4  mm
CM:        3.9  mm

Est. FW:     245   gm     0 lb 9 oz     65  %
Gestational Age

LMP:           22w 0d        Date:  04/12/17                 EDD:    01/17/18
U/S Today:     18w 2d                                        EDD:    02/12/18
Best:          17w 5d     Det. By:  U/S C R L  (08/16/17)    EDD:    02/16/18
Anatomy

Cranium:               Appears normal         Aortic Arch:            Appears normal
Cavum:                 Appears normal         Ductal Arch:            Appears normal
Ventricles:            Appears normal         Diaphragm:              Appears normal
Choroid Plexus:        Appears normal         Stomach:                Appears normal, left
sided
Cerebellum:            Appears normal         Abdomen:                Appears normal
Posterior Fossa:       Appears normal         Abdominal Wall:         Appears nml (cord
insert, abd wall)
Nuchal Fold:           Appears normal         Cord Vessels:           Appears normal (3
vessel cord)
Face:                  Appears normal         Kidneys:                Appear normal
(orbits and profile)
Lips:                  Appears normal         Bladder:                Appears normal
Thoracic:              Appears normal         Spine:                  Appears normal
Heart:                 Limited views,         Upper Extremities:      Appears normal
appeared normal
RVOT:                  Appears normal,        Lower Extremities:      Appears normal
color
LVOT:                  Appears normal

Other:  Male gender. Heels visualized. Nasal bone visualized. Technically
difficult due to maternal habitus and early GA.
Cervix Uterus Adnexa

Cervix
Normal appearance by transabdominal scan.

Uterus
No abnormality visualized.

Left Ovary
Not visualized.

Right Ovary
Not visualized.

Cul De Sac:   No free fluid seen.

Adnexa:       No abnormality visualized.
Impression

SIUP at 17+5 weeks
Normal detailed fetal anatomy
Markers of aneuploidy: none
Normal amniotic fluid volume
Measurements consistent with prior US
Recommendations

Follow-up ultrasound for growth in 6 weeks

## 2019-06-12 IMAGING — US US FETAL BPP W/ NON-STRESS
1 series · 10 of 10 positions shown · non-contrast
Comparison: none

[Series 1: us fetal bpp w/nonstress · 10 acquisitions, 10 frames shown]
[im 1/10]
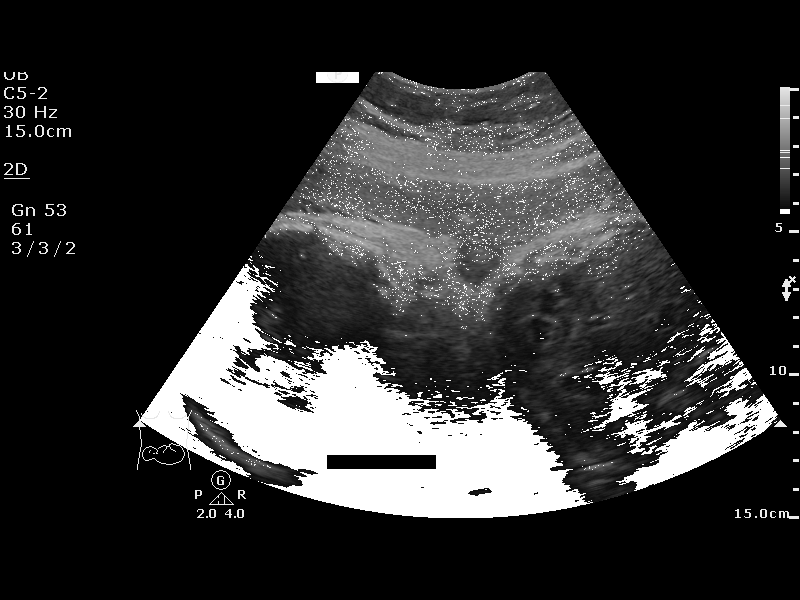
[im 2/10]
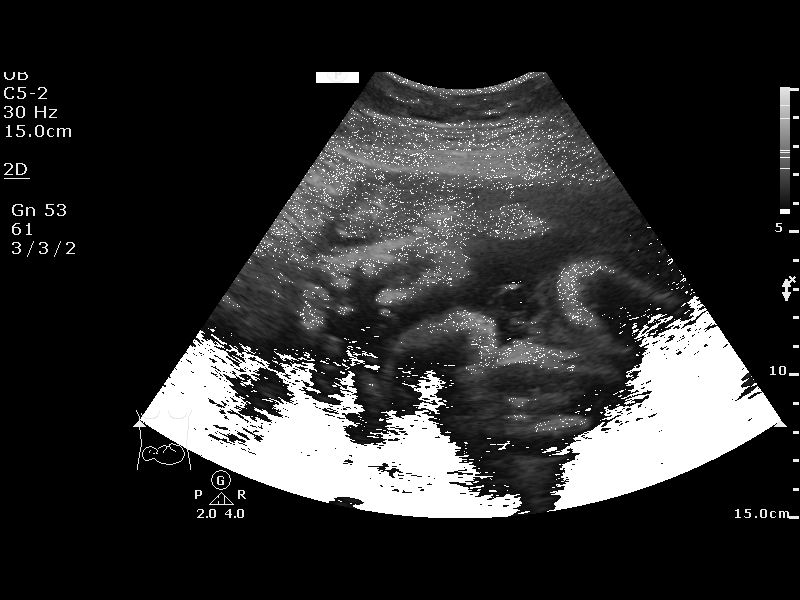
[im 3/10]
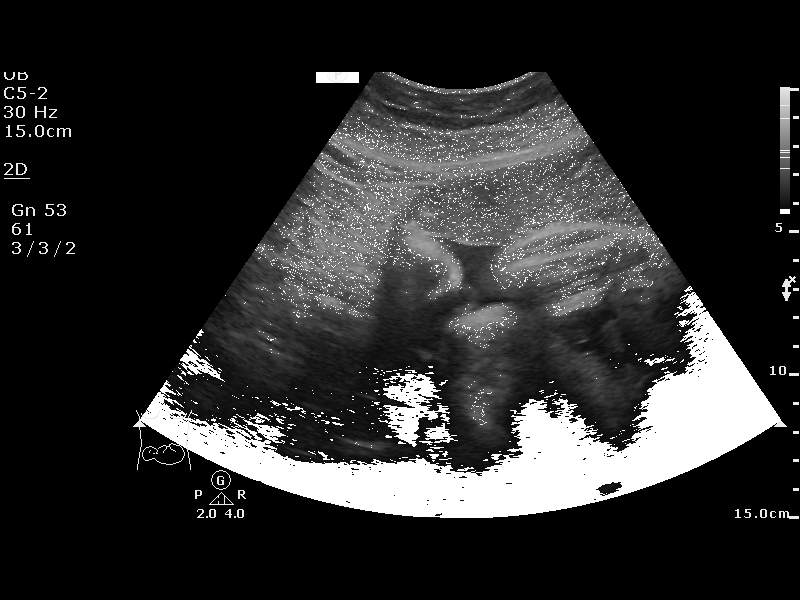
[im 4/10]
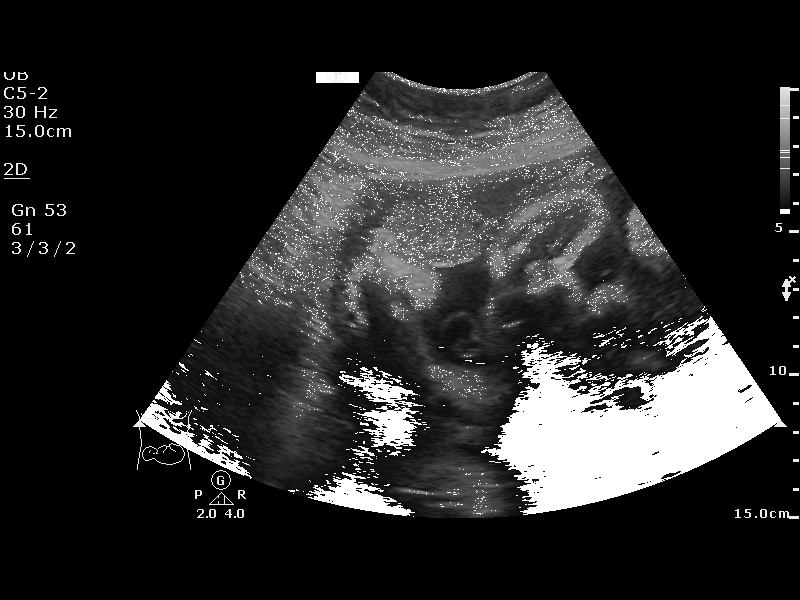
[im 5/10]
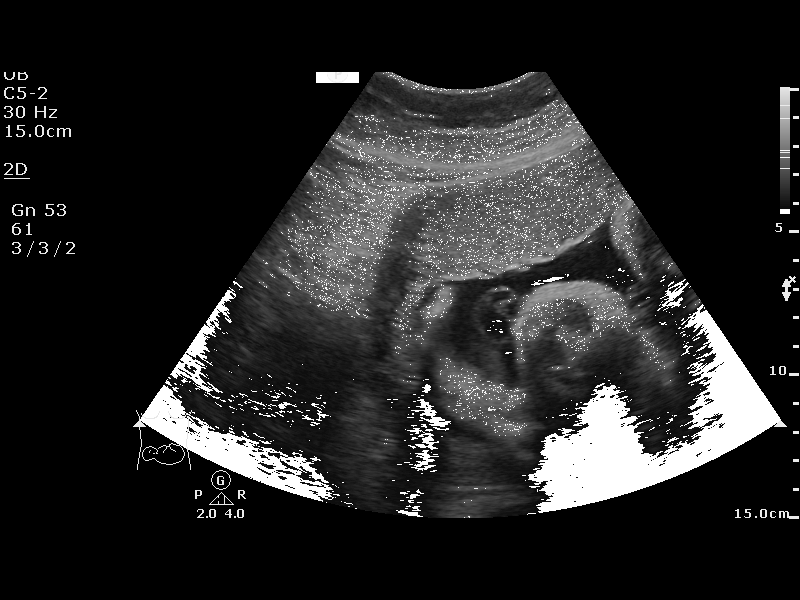
[im 6/10]
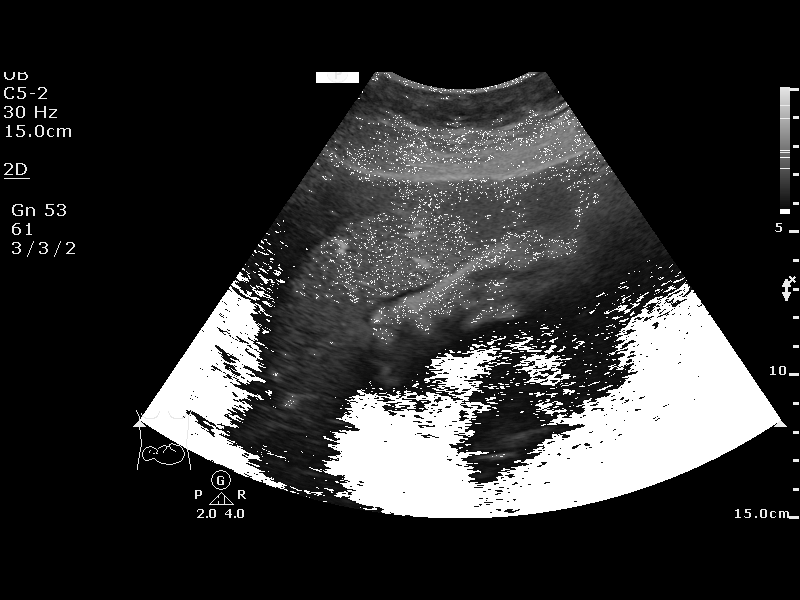
[im 7/10]
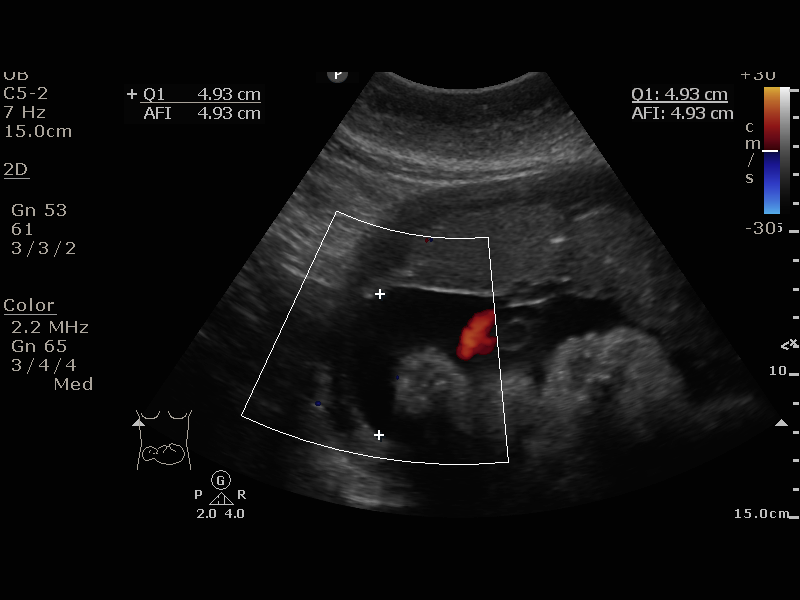
[im 8/10]
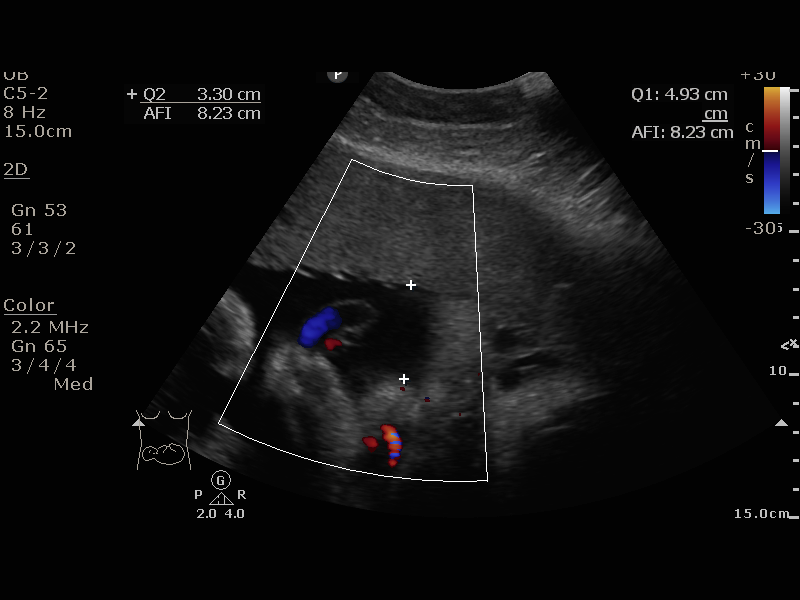
[im 9/10]
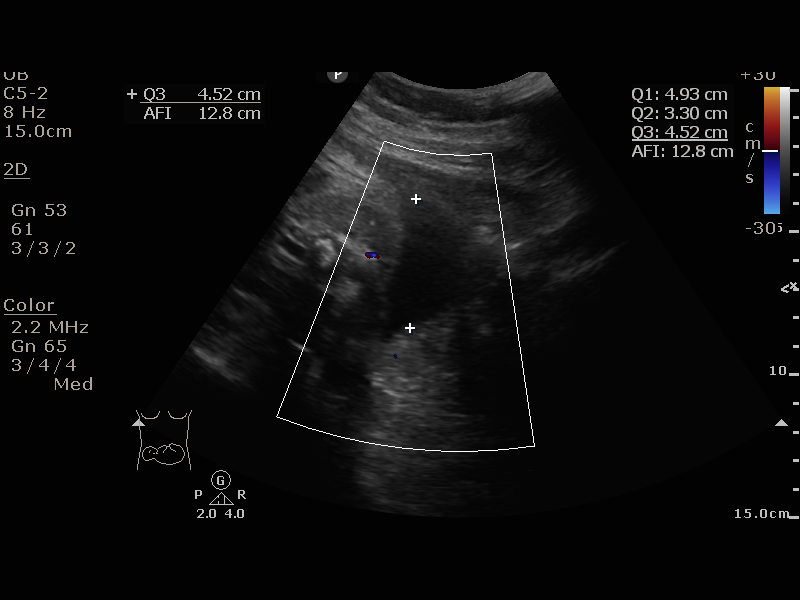
[im 10/10]
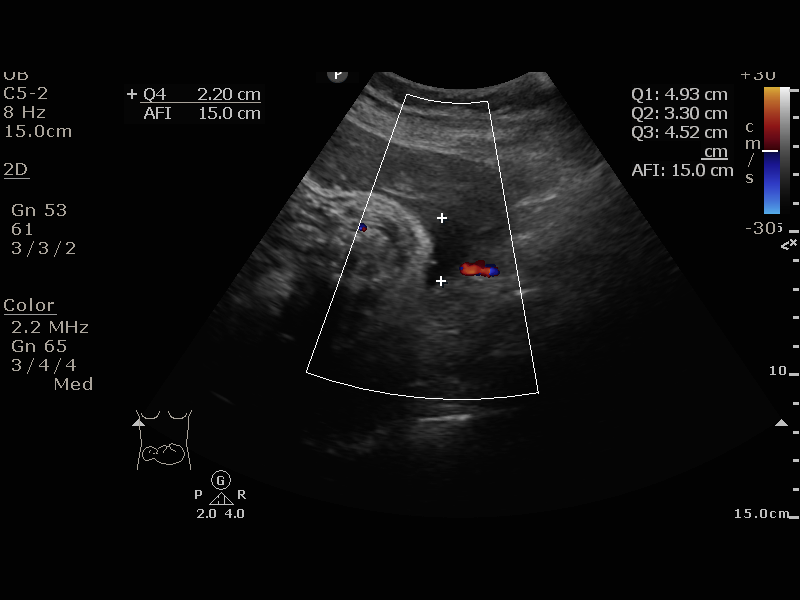

[10 of 10 positions shown; findings below may reference images not displayed]

[facility]
[REDACTED]

1  US FETAL BPP W/NONSTRESS                    76818.4

1  BAYLOR VENKATESH          [id number]      [id number]     [id number]
Service(s) Provided

Indications

34 weeks gestation of pregnancy
Hypertension - Chronic/Pre-existing
OB History

Gravidity:    5         Term:   1        Prem:   0        SAB:   3
TOP:          0       Ectopic:  0        Living: 1
Fetal Evaluation

Num Of Fetuses:     1
Preg. Location:     Intrauterine
Cardiac Activity:   Observed
Presentation:       Transverse, head to maternal right

Amniotic Fluid
AFI FV:      Subjectively within normal limits

AFI Sum(cm)     %Tile       Largest Pocket(cm)
14.95           54

RUQ(cm)       RLQ(cm)       LUQ(cm)        LLQ(cm)
4.93
Biophysical Evaluation

Amniotic F.V:   Pocket => 2 cm two         F. Tone:        Observed
planes
F. Movement:    Observed                   N.S.T:          Reactive
F. Breathing:   Not Observed               Score:          [DATE]
Gestational Age

LMP:           38w 5d        Date:  04/12/17                 EDD:   01/17/18
Best:          34w 3d     Det. By:  U/S C R L  (08/16/17)    EDD:   02/16/18
Impression

Reassuring antenatal testing
Recommendations

Continue weekly testing

## 2020-08-19 ENCOUNTER — Other Ambulatory Visit: Payer: Self-pay

## 2020-08-19 ENCOUNTER — Ambulatory Visit
Admission: EM | Admit: 2020-08-19 | Discharge: 2020-08-19 | Disposition: A | Discharge status: Home / Self Care | Payer: Non-veteran care | Attending: Emergency Medicine | Admitting: Emergency Medicine

## 2020-08-19 ENCOUNTER — Encounter: Payer: Self-pay | Admitting: Emergency Medicine

## 2020-08-19 DIAGNOSIS — R059 Cough, unspecified: Secondary | ICD-10-CM

## 2020-08-19 MED ORDER — PREDNISONE 20 MG PO TABS: 20.0000 mg | ORAL_TABLET | Freq: Every day | ORAL | 0 refills | Status: DC

## 2020-08-19 MED ORDER — BENZONATATE 100 MG PO CAPS: 100.0000 mg | ORAL_CAPSULE | Freq: Three times a day (TID) | ORAL | 0 refills | Status: DC

## 2020-08-19 MED ORDER — ALBUTEROL SULFATE HFA 108 (90 BASE) MCG/ACT IN AERS: 2.0000 | INHALATION_SPRAY | Freq: Four times a day (QID) | RESPIRATORY_TRACT | 0 refills | Status: AC | PRN

## 2020-08-19 NOTE — ED Provider Notes (Signed)
[facility]    CSN: [id number] Arrival date & time: 08/19/20  1531      History   Chief Complaint Chief Complaint  Patient presents with  . Sore Throat    from inhaler    HPI Pamela Duncan is a 39 y.o. female  Subjective:   Pamela Duncan is a 39 y.o. female here for evaluation of a cough.  The cough is non-productive, without wheezing, dyspnea or hemoptysis, worsening over time and is aggravated by cold air, dust, fumes and pollens. Onset of symptoms was 3 days ago, gradually worsening since that time.  Associated symptoms include sore throat when coughing. Patient does have a history of asthma. Patient has not had recent travel. Patient does have a history of smoking. Patient  has not had a previous chest x-ray. Patient has not had a PPD done. The following portions of the patient's history were reviewed and updated as appropriate: allergies, current medications, past family history, past medical history, past social history, past surgical history and problem list.     Past Medical History:  Diagnosis Date  . Abnormal Pap smear    colpo  . ADHD (attention deficit hyperactivity disorder)   . Anxiety   . Asthma   . Degenerative disc disease, lumbar   . Depression    PP depression with meds and counselor   . Hypertension    stopped taking medicine  . Insomnia   . Interstitial cystitis   . Overactive bladder   . Vaginal Pap smear, abnormal     Patient Active Problem List   Diagnosis Date Noted  . Normal labor 02/14/2018  . Obesity during pregnancy 02/13/2018  . Tobacco smoking affecting pregnancy in second trimester 10/31/2017  . Insomnia 10/31/2017  . Nausea 10/31/2017  . Constipation in pregnancy in second trimester 10/31/2017  . Supervision of other normal pregnancy, antepartum 08/10/2017  . Rh negative state in antepartum period 08/10/2017  . Anxiety disorder affecting pregnancy, antepartum 08/10/2017  . Depression 08/10/2017  . Overactive  bladder 08/10/2017  . Hypertension in pregnancy, antepartum 08/10/2017  . Advanced maternal age in multigravida 08/10/2017  . Benign essential hypertension 08/10/2017    Past Surgical History:  Procedure Laterality Date  . COLPOSCOPY  2013  . WISDOM TOOTH EXTRACTION      OB History    Gravida  5   Para  2   Term  2   Preterm  0   AB  3   Living  2     SAB  3   TAB  0   Ectopic  0   Multiple  0   Live Births  2            Home Medications    Prior to Admission medications   Medication Sig Start Date End Date Taking? Authorizing Provider  FLUoxetine (PROZAC) 20 MG tablet Take 20 mg by mouth daily.   Yes [provider]  ibuprofen (ADVIL,MOTRIN) 600 MG tablet Take 1 tablet (600 mg total) by mouth every 6 (six) hours. 02/16/18  Yes Lise Auer, MD  albuterol (VENTOLIN HFA) 108 (90 Base) MCG/ACT inhaler Inhale 2 puffs into the lungs every 6 (six) hours as needed for wheezing or shortness of breath. 08/19/20   Hall-Potvin, [place], PA-C  benzonatate (TESSALON) 100 MG capsule Take 1 capsule (100 mg total) by mouth every 8 (eight) hours. 08/19/20   Hall-Potvin, [place], PA-C  cyclobenzaprine (FLEXERIL) 10 MG tablet Take 1 tablet (10 mg total) by  mouth 3 (three) times daily as needed for muscle spasms. 02/06/18   Hermina Staggers, MD  predniSONE (DELTASONE) 20 MG tablet Take 1 tablet (20 mg total) by mouth daily. 08/19/20   Hall-Potvin, [place], PA-C  Prenatal Vit-Fe Fumarate-FA (PRENATAL MULTIVITAMIN) TABS tablet Take 1 tablet by mouth daily at 12 noon.    [provider]    Family History Family History  Problem Relation Age of Onset  . Heart disease Mother   . Lung cancer Father   . Other Neg Hx     Social History Social History   Tobacco Use  . Smoking status: Current Every Day Smoker    Types: Cigars  . Smokeless tobacco: Never Used  . Tobacco comment: 3 cigars per day  Vaping Use  . Vaping Use: Never used  Substance Use  Topics  . Alcohol use: No  . Drug use: Yes    Types: Marijuana    Comment: chewable     Allergies   Codeine and Latex   Review of Systems Review of Systems  Constitutional: Negative for fatigue and fever.  HENT: Positive for sore throat. Negative for ear pain, postnasal drip, sinus pain and voice change.   Eyes: Negative for pain, redness and visual disturbance.  Respiratory: Positive for cough. Negative for shortness of breath.   Cardiovascular: Negative for chest pain and palpitations.  Gastrointestinal: Negative for abdominal pain, diarrhea and vomiting.  Musculoskeletal: Negative for arthralgias and myalgias.  Skin: Negative for rash and wound.  Neurological: Negative for syncope and headaches.     Physical Exam Triage Vital Signs ED Triage Vitals  Enc Vitals Group     BP      Pulse      Resp      Temp      Temp src      SpO2      Weight      Height      Head Circumference      Peak Flow      Pain Score      Pain Loc      Pain Edu?      Excl. in GC?    No data found.  Updated Vital Signs BP 120/72 (BP Location: Left Arm)   Pulse 84   Temp 98.2 F (36.8 C) (Oral)   Resp 18   SpO2 96%   Visual Acuity Right Eye Distance:   Left Eye Distance:   Bilateral Distance:    Right Eye Near:   Left Eye Near:    Bilateral Near:     Physical Exam Constitutional:      General: She is not in acute distress.    Appearance: She is not ill-appearing or diaphoretic.  HENT:     Head: Normocephalic and atraumatic.     Mouth/Throat:     Mouth: Mucous membranes are moist.     Pharynx: Oropharynx is clear. No oropharyngeal exudate or posterior oropharyngeal erythema.  Eyes:     General: No scleral icterus.    Conjunctiva/sclera: Conjunctivae normal.     Pupils: Pupils are equal, round, and reactive to light.  Neck:     Comments: Trachea midline, negative JVD Cardiovascular:     Rate and Rhythm: Normal rate and regular rhythm.     Heart sounds: No murmur  heard.  No gallop.   Pulmonary:     Effort: Pulmonary effort is normal. No respiratory distress.     Breath sounds: Wheezing present. No rhonchi or  rales.     Comments: Mild, scattered.  No prolonged expiratory phase. Musculoskeletal:     Cervical back: Neck supple. No tenderness.  Lymphadenopathy:     Cervical: No cervical adenopathy.  Skin:    Capillary Refill: Capillary refill takes less than 2 seconds.     Coloration: Skin is not jaundiced or pale.     Findings: No rash.  Neurological:     General: No focal deficit present.     Mental Status: She is alert and oriented to person, place, and time.      UC Treatments / Results  Labs (all labs ordered are listed, but only abnormal results are displayed) Labs Reviewed - No data to display  EKG   Radiology No results found.  Procedures Procedures (including critical care time)  Medications Ordered in UC Medications - No data to display  Initial Impression / Assessment and Plan / UC Course  I have reviewed the triage vital signs and the nursing notes.  Pertinent labs & imaging results that were available during my care of the patient were reviewed by me and considered in my medical decision making (see chart for details).     Patient afebrile, nontoxic, with SpO2 96%.  Pt declined covid testing; feels sx related to weather/allergies & asthma.  We will treat supportively as outlined below.  Return precautions discussed, patient verbalized understanding and is agreeable to plan. Final Clinical Impressions(s) / UC Diagnoses   Final diagnoses:  Cough     Discharge Instructions     Tessalon for cough. Start flonase, atrovent nasal spray for nasal congestion/drainage. You can use over the counter nasal saline rinse such as neti pot for nasal congestion. Keep hydrated, your urine should be clear to pale yellow in color. Tylenol/motrin for fever and pain. Monitor for any worsening of symptoms, chest pain, shortness of  breath, wheezing, swelling of the throat, go to the emergency department for further evaluation needed.     ED Prescriptions    Medication Sig Dispense Auth. Provider   benzonatate (TESSALON) 100 MG capsule Take 1 capsule (100 mg total) by mouth every 8 (eight) hours. 21 capsule Hall-Potvin, [place], PA-C   predniSONE (DELTASONE) 20 MG tablet Take 1 tablet (20 mg total) by mouth daily. 5 tablet Hall-Potvin, [place], PA-C   albuterol (VENTOLIN HFA) 108 (90 Base) MCG/ACT inhaler Inhale 2 puffs into the lungs every 6 (six) hours as needed for wheezing or shortness of breath. 18 g Hall-Potvin, [place], PA-C     PDMP not reviewed this encounter.   Hall-Potvin, [place], [place] 08/19/20 1653

## 2020-08-19 NOTE — Discharge Instructions (Addendum)

## 2020-08-19 NOTE — ED Triage Notes (Signed)
Pt states she began having asthma type issues approximately 3 days ago and is currently experiencing a sore or irritated throat but believes it to be from her inhaler. Pt states she only has a sore throat when she is short of breath. Pt has good cap refill/coplor and is speaking in full sentences. Pt is aox4 and ambulatory.

## 2023-08-09 ENCOUNTER — Other Ambulatory Visit (HOSPITAL_COMMUNITY): Payer: Self-pay

## 2023-08-10 ENCOUNTER — Other Ambulatory Visit (HOSPITAL_COMMUNITY): Payer: Self-pay

## 2023-08-10 ENCOUNTER — Other Ambulatory Visit: Payer: Self-pay

## 2023-08-10 MED ORDER — OXYCODONE HCL 10 MG PO TABS
10.0000 mg | ORAL_TABLET | Freq: Three times a day (TID) | ORAL | 0 refills | Status: AC | PRN
  Filled 2023-08-10: qty 90, 30d supply, fill #0

## 2023-10-15 ENCOUNTER — Encounter (HOSPITAL_BASED_OUTPATIENT_CLINIC_OR_DEPARTMENT_OTHER): Payer: Self-pay

## 2023-10-15 ENCOUNTER — Ambulatory Visit (HOSPITAL_BASED_OUTPATIENT_CLINIC_OR_DEPARTMENT_OTHER)
Admission: EM | Admit: 2023-10-15 | Discharge: 2023-10-15 | Disposition: A | Discharge status: Home / Self Care | Payer: Medicare HMO | Attending: Internal Medicine | Admitting: Internal Medicine

## 2023-10-15 DIAGNOSIS — J45909 Unspecified asthma, uncomplicated: Secondary | ICD-10-CM | POA: Insufficient documentation

## 2023-10-15 DIAGNOSIS — J42 Unspecified chronic bronchitis: Secondary | ICD-10-CM | POA: Insufficient documentation

## 2023-10-15 DIAGNOSIS — F17210 Nicotine dependence, cigarettes, uncomplicated: Secondary | ICD-10-CM | POA: Insufficient documentation

## 2023-10-15 DIAGNOSIS — J209 Acute bronchitis, unspecified: Secondary | ICD-10-CM | POA: Diagnosis not present

## 2023-10-15 DIAGNOSIS — R0602 Shortness of breath: Secondary | ICD-10-CM | POA: Insufficient documentation

## 2023-10-15 DIAGNOSIS — J029 Acute pharyngitis, unspecified: Secondary | ICD-10-CM | POA: Diagnosis present

## 2023-10-15 DIAGNOSIS — R059 Cough, unspecified: Secondary | ICD-10-CM | POA: Diagnosis present

## 2023-10-15 MED ORDER — GUAIFENESIN ER 1200 MG PO TB12: 1200.0000 mg | ORAL_TABLET | Freq: Two times a day (BID) | ORAL | 0 refills | Status: DC

## 2023-10-15 MED ORDER — PROMETHAZINE-DM 6.25-15 MG/5ML PO SYRP: 5.0000 mL | ORAL_SOLUTION | Freq: Every evening | ORAL | 0 refills | Status: DC | PRN

## 2023-10-15 MED ORDER — PREDNISONE 20 MG PO TABS: 40.0000 mg | ORAL_TABLET | Freq: Every day | ORAL | 0 refills | Status: AC

## 2023-10-15 NOTE — ED Provider Notes (Signed)
[facility]    CSN: [id number] Arrival date & time: 10/15/23  1234      History   Chief Complaint Chief Complaint  Patient presents with   Cough   Sore Throat    HPI Pamela Duncan is a 42 y.o. female.   Patient presents to urgent care for evaluation of cough, sore throat, nasal congestion, and generalized fatigue that started 3 days ago on Thursday, October 12, 2023.  Her son is sick with similar symptoms.  Cough is mostly dry and nonproductive with associated intermittent shortness of breath with coughing fits.  Cough is worse at nighttime.  Reports intermittent chest tightness associated with cough as well.  History of chronic bronchitis and asthma.  Current everyday cigarette smoker, denies other drug use.  Denies nausea, vomiting, diarrhea, rash, dizziness, and fever.  She is using her albuterol inhaler frequently with temporary relief of shortness of breath associated with coughing.  Denies recent antibiotic/steroid use.   Cough Sore Throat    Past Medical History:  Diagnosis Date   Abnormal Pap smear    colpo   ADHD (attention deficit hyperactivity disorder)    Anxiety    Asthma    Degenerative disc disease, lumbar    Depression    PP depression with meds and counselor    Hypertension    stopped taking medicine   Insomnia    Interstitial cystitis    Overactive bladder    Vaginal Pap smear, abnormal     Patient Active Problem List   Diagnosis Date Noted   Normal labor 02/14/2018   Obesity during pregnancy 02/13/2018   Tobacco smoking affecting pregnancy in second trimester 10/31/2017   Insomnia 10/31/2017   Nausea 10/31/2017   Constipation in pregnancy in second trimester 10/31/2017   Supervision of other normal pregnancy, antepartum 08/10/2017   Rh negative state in antepartum period 08/10/2017   Anxiety disorder affecting pregnancy, antepartum 08/10/2017   Depression 08/10/2017   Overactive bladder 08/10/2017   Hypertension in pregnancy,  antepartum 08/10/2017   Advanced maternal age in multigravida 08/10/2017   Benign essential hypertension 08/10/2017    Past Surgical History:  Procedure Laterality Date   COLPOSCOPY  2013   WISDOM TOOTH EXTRACTION      OB History     Gravida  5   Para  2   Term  2   Preterm  0   AB  3   Living  2      SAB  3   IAB  0   Ectopic  0   Multiple  0   Live Births  2            Home Medications    Prior to Admission medications   Medication Sig Start Date End Date Taking? Authorizing Provider  aspirin EC 81 MG tablet Take 81 mg by mouth daily. Swallow whole.   Yes [provider]  clonazePAM (KLONOPIN) 0.5 MG tablet Take 0.5 mg by mouth 2 (two) times daily as needed for anxiety.   Yes [provider]  Guaifenesin 1200 MG TB12 Take 1 tablet (1,200 mg total) by mouth in the morning and at bedtime. 10/15/23  Yes Carlisle Beers, FNP  predniSONE (DELTASONE) 20 MG tablet Take 2 tablets (40 mg total) by mouth daily with breakfast for 5 days. 10/15/23 10/20/23 Yes Carlisle Beers, FNP  promethazine-dextromethorphan (PROMETHAZINE-DM) 6.25-15 MG/5ML syrup Take 5 mLs by mouth at bedtime as needed for cough. 10/15/23  Yes Reita May  M, FNP  albuterol (VENTOLIN HFA) 108 (90 Base) MCG/ACT inhaler Inhale 2 puffs into the lungs every 6 (six) hours as needed for wheezing or shortness of breath. 08/19/20   Hall-Potvin, [place], PA-C  benzonatate (TESSALON) 100 MG capsule Take 1 capsule (100 mg total) by mouth every 8 (eight) hours. 08/19/20   Hall-Potvin, [place], PA-C  cyclobenzaprine (FLEXERIL) 10 MG tablet Take 1 tablet (10 mg total) by mouth 3 (three) times daily as needed for muscle spasms. 02/06/18   Hermina Staggers, MD  FLUoxetine (PROZAC) 20 MG tablet Take 20 mg by mouth daily.    [provider]  ibuprofen (ADVIL,MOTRIN) 600 MG tablet Take 1 tablet (600 mg total) by mouth every 6 (six) hours. 02/16/18   Lise Auer, MD   Oxycodone HCl 10 MG TABS Take 1 tablet (10 mg total) by mouth 3 (three) times daily as needed. 08/10/23     Prenatal Vit-Fe Fumarate-FA (PRENATAL MULTIVITAMIN) TABS tablet Take 1 tablet by mouth daily at 12 noon.    [provider]    Family History Family History  Problem Relation Age of Onset   Heart disease Mother    Lung cancer Father    Other Neg Hx     Social History Social History   Tobacco Use   Smoking status: Every Day    Types: Cigars   Smokeless tobacco: Never   Tobacco comments:    3 cigars per day  Vaping Use   Vaping status: Never Used  Substance Use Topics   Alcohol use: No   Drug use: Not Currently    Comment: chewable     Allergies   Codeine and Latex   Review of Systems Review of Systems  Respiratory:  Positive for cough.   Per HPI   Physical Exam Triage Vital Signs ED Triage Vitals  Encounter Vitals Group     BP 10/15/23 1313 118/80     Systolic BP Percentile --      Diastolic BP Percentile --      Pulse Rate 10/15/23 1313 73     Resp 10/15/23 1313 20     Temp 10/15/23 1313 98.3 F (36.8 C)     Temp Source 10/15/23 1313 Oral     SpO2 10/15/23 1313 92 %     Weight 10/15/23 1314 160 lb (72.6 kg)     Height --      Head Circumference --      Peak Flow --      Pain Score 10/15/23 1314 7     Pain Loc --      Pain Education --      Exclude from Growth Chart --    No data found.  Updated Vital Signs BP 118/80 (BP Location: Right Arm)   Pulse 73   Temp 98.3 F (36.8 C) (Oral)   Resp 20   Wt 160 lb (72.6 kg)   SpO2 92%   BMI 26.63 kg/m   Visual Acuity Right Eye Distance:   Left Eye Distance:   Bilateral Distance:    Right Eye Near:   Left Eye Near:    Bilateral Near:     Physical Exam Vitals and nursing note reviewed.  Constitutional:      Appearance: She is not ill-appearing or toxic-appearing.  HENT:     Head: Normocephalic and atraumatic.     Right Ear: Hearing, tympanic membrane, ear canal and external  ear normal.     Left Ear: Hearing, tympanic membrane,  ear canal and external ear normal.     Nose: Congestion present.     Mouth/Throat:     Lips: Pink.     Mouth: Mucous membranes are moist. No injury.     Tongue: No lesions. Tongue does not deviate from midline.     Palate: No mass and lesions.     Pharynx: Oropharynx is clear. Uvula midline. Posterior oropharyngeal erythema present. No pharyngeal swelling, oropharyngeal exudate or uvula swelling.     Tonsils: No tonsillar exudate or tonsillar abscesses. 1+ on the right. 1+ on the left.     Comments: Mild erythema to the posterior oropharynx with evidence of clear postnasal drainage.  Maintaining secretions without difficulty/drooling.  Normal phonation. Eyes:     General: Lids are normal. Vision grossly intact. Gaze aligned appropriately.     Extraocular Movements: Extraocular movements intact.     Conjunctiva/sclera: Conjunctivae normal.  Cardiovascular:     Rate and Rhythm: Normal rate and regular rhythm.     Heart sounds: Normal heart sounds, S1 normal and S2 normal.  Pulmonary:     Effort: Pulmonary effort is normal. No respiratory distress.     Breath sounds: Normal air entry. Wheezing present. No rhonchi or rales.     Comments: Faint expiratory wheeze heard to the bilateral upper lung fields with coarse breath sounds throughout. Chest:     Chest wall: No tenderness.  Musculoskeletal:     Cervical back: Neck supple.  Lymphadenopathy:     Cervical: No cervical adenopathy.  Skin:    General: Skin is warm and dry.     Capillary Refill: Capillary refill takes less than 2 seconds.     Findings: No rash.  Neurological:     General: No focal deficit present.     Mental Status: She is alert and oriented to person, place, and time. Mental status is at baseline.     Cranial Nerves: No dysarthria or facial asymmetry.  Psychiatric:        Mood and Affect: Mood normal.        Speech: Speech normal.        Behavior: Behavior normal.         Thought Content: Thought content normal.        Judgment: Judgment normal.      UC Treatments / Results  Labs (all labs ordered are listed, but only abnormal results are displayed) Labs Reviewed  SARS CORONAVIRUS 2 (TAT 6-24 HRS)    EKG   Radiology No results found.  Procedures Procedures (including critical care time)  Medications Ordered in UC Medications - No data to display  Initial Impression / Assessment and Plan / UC Course  I have reviewed the triage vital signs and the nursing notes.  Pertinent labs & imaging results that were available during my care of the patient were reviewed by me and considered in my medical decision making (see chart for details).   1.  Acute bronchitis, cigarette nicotine dependence uncomplicated, shortness of breath Presentation consistent with acute viral bronchitis. Patient non-toxic in appearance, vital signs hemodynamically stable, no new oxygen requirement. Low suspicion for acute cardiopulmonary abnormality, therefore deferred imaging of the chest.  Strep/Viral testing: COVID testing pending  Will treat with steroid (prednisone burst), bronchodilator, cough suppressants for symptomatic relief, and expectorants (mucinex) as needed.   Counseled patient on potential for adverse effects with medications prescribed/recommended today, strict ER and return-to-clinic precautions discussed, patient verbalized understanding.    Final Clinical Impressions(s) / UC Diagnoses  Final diagnoses:  Acute bronchitis, unspecified organism  Cigarette nicotine dependence, uncomplicated     Discharge Instructions      You have bronchitis which is inflammation of the upper airways in your lungs due to a virus. The following medicines will help with your symptoms.   - Take steroid pills sent to pharmacy as directed. Do not take any other NSAID containing medications such as ibuprofen or naproxen/Aleve while taking prednisone. - You may  use albuterol inhaler 1 to 2 puffs every 4-6 hours as needed for cough, shortness of breath, and wheezing. - Take cough medicines as prescribed.  - Continue using over the counter medicines as needed as directed. Plain mucinex (guaifenesin) over the counter may further help breakup mucus and help with symptoms.   If you develop any new or worsening symptoms or do not improve in the next 2 to 3 days, please return.  If your symptoms are severe, please go to the emergency room. Follow-up with PCP as needed.     ED Prescriptions     Medication Sig Dispense Auth. Provider   Guaifenesin 1200 MG TB12 Take 1 tablet (1,200 mg total) by mouth in the morning and at bedtime. 14 tablet Carlisle Beers, FNP   promethazine-dextromethorphan (PROMETHAZINE-DM) 6.25-15 MG/5ML syrup Take 5 mLs by mouth at bedtime as needed for cough. 118 mL Reita May M, FNP   predniSONE (DELTASONE) 20 MG tablet Take 2 tablets (40 mg total) by mouth daily with breakfast for 5 days. 10 tablet Carlisle Beers, FNP      PDMP not reviewed this encounter.   Carlisle Beers, [place] 10/15/23 1346

## 2023-10-15 NOTE — ED Triage Notes (Signed)
Cough, productive at times. Sore throat onset over last few days with worsening. Patient has hx of asthma. +smoker. Having to use inhaler more often. Has nebulizer at home and has not needed to use it for this illness.

## 2023-10-15 NOTE — Discharge Instructions (Addendum)
You have bronchitis which is inflammation of the upper airways in your lungs due to a virus. The following medicines will help with your symptoms.   - Take steroid pills sent to pharmacy as directed. Do not take any other NSAID containing medications such as ibuprofen or naproxen/Aleve while taking prednisone. - You may use albuterol inhaler 1 to 2 puffs every 4-6 hours as needed for cough, shortness of breath, and wheezing. - Take cough medicines as prescribed.  - Continue using over the counter medicines as needed as directed. Plain mucinex (guaifenesin) over the counter may further help breakup mucus and help with symptoms.   If you develop any new or worsening symptoms or do not improve in the next 2 to 3 days, please return.  If your symptoms are severe, please go to the emergency room. Follow-up with PCP as needed.

## 2023-10-16 LAB — SARS CORONAVIRUS 2 (TAT 6-24 HRS): SARS Coronavirus 2: NEGATIVE

## 2023-12-05 ENCOUNTER — Other Ambulatory Visit (HOSPITAL_BASED_OUTPATIENT_CLINIC_OR_DEPARTMENT_OTHER): Payer: Self-pay

## 2023-12-05 ENCOUNTER — Encounter (HOSPITAL_BASED_OUTPATIENT_CLINIC_OR_DEPARTMENT_OTHER): Payer: Self-pay

## 2023-12-05 ENCOUNTER — Ambulatory Visit (HOSPITAL_BASED_OUTPATIENT_CLINIC_OR_DEPARTMENT_OTHER)
Admission: EM | Admit: 2023-12-05 | Discharge: 2023-12-05 | Disposition: A | Discharge status: Home / Self Care | Payer: Medicare HMO

## 2023-12-05 DIAGNOSIS — J101 Influenza due to other identified influenza virus with other respiratory manifestations: Secondary | ICD-10-CM

## 2023-12-05 DIAGNOSIS — R519 Headache, unspecified: Secondary | ICD-10-CM

## 2023-12-05 DIAGNOSIS — J45901 Unspecified asthma with (acute) exacerbation: Secondary | ICD-10-CM | POA: Diagnosis not present

## 2023-12-05 LAB — POC COVID19/FLU A&B COMBO
Covid Antigen, POC: NEGATIVE
Influenza A Antigen, POC: POSITIVE — AB
Influenza B Antigen, POC: NEGATIVE

## 2023-12-05 MED ORDER — PREDNISONE 20 MG PO TABS
40.0000 mg | ORAL_TABLET | Freq: Every day | ORAL | 0 refills | Status: DC
  Filled 2023-12-05: qty 10, 5d supply, fill #0

## 2023-12-05 MED ORDER — KETOROLAC TROMETHAMINE 30 MG/ML IJ SOLN
30.0000 mg | Freq: Once | INTRAMUSCULAR | Status: AC
  Administered 2023-12-05: 30 mg via INTRAMUSCULAR

## 2023-12-05 MED ORDER — OSELTAMIVIR PHOSPHATE 75 MG PO CAPS
75.0000 mg | ORAL_CAPSULE | Freq: Two times a day (BID) | ORAL | 0 refills | Status: DC
  Filled 2023-12-05: qty 10, 5d supply, fill #0

## 2023-12-05 NOTE — ED Provider Notes (Signed)
[facility]    CSN: [id number] Arrival date & time: 12/05/23  1512      History   Chief Complaint Chief Complaint  Patient presents with   Emesis   Cough   Headache   Fever    HPI Pamela Duncan is a 43 y.o. female.   Patient here today with a 1-1/2-day history of nausea with vomiting, cough, headache and fever.  Son has been sick with similar symptoms for close to 2 days.  Patient became febrile today with a temp of 2.5 and complains of a severe generalized headache.  Patient has a history of asthma and reports she has been using her nebulizer machine more frequently over the last day she has been wheezing and coughing persistently.  Patient reports she has had her influenza vaccine this year.  Patient is a daily smoker. Past Medical History:  Diagnosis Date   Abnormal Pap smear    colpo   ADHD (attention deficit hyperactivity disorder)    Anxiety    Asthma    Degenerative disc disease, lumbar    Depression    PP depression with meds and counselor    Hypertension    stopped taking medicine   Insomnia    Interstitial cystitis    Overactive bladder    Vaginal Pap smear, abnormal     Patient Active Problem List   Diagnosis Date Noted   Normal labor 02/14/2018   Obesity during pregnancy 02/13/2018   Tobacco smoking affecting pregnancy in second trimester 10/31/2017   Insomnia 10/31/2017   Nausea 10/31/2017   Constipation in pregnancy in second trimester 10/31/2017   Supervision of other normal pregnancy, antepartum 08/10/2017   Rh negative state in antepartum period 08/10/2017   Anxiety disorder affecting pregnancy, antepartum 08/10/2017   Depression 08/10/2017   Overactive bladder 08/10/2017   Hypertension in pregnancy, antepartum 08/10/2017   Advanced maternal age in multigravida 08/10/2017   Benign essential hypertension 08/10/2017    Past Surgical History:  Procedure Laterality Date   COLPOSCOPY  2013   WISDOM TOOTH EXTRACTION      OB  History     Gravida  5   Para  2   Term  2   Preterm  0   AB  3   Living  2      SAB  3   IAB  0   Ectopic  0   Multiple  0   Live Births  2            Home Medications    Prior to Admission medications   Medication Sig Start Date End Date Taking? Authorizing Provider  oseltamivir (TAMIFLU) 75 MG capsule Take 1 capsule (75 mg total) by mouth 2 (two) times daily. 12/05/23  Yes Bing Neighbors, NP  predniSONE (DELTASONE) 20 MG tablet Take 2 tablets (40 mg total) by mouth daily with breakfast. 12/05/23  Yes Bing Neighbors, NP  promethazine (PHENERGAN) 25 MG tablet Take 25 mg by mouth every 6 (six) hours as needed for nausea or vomiting.   Yes [provider]  albuterol (VENTOLIN HFA) 108 (90 Base) MCG/ACT inhaler Inhale 2 puffs into the lungs every 6 (six) hours as needed for wheezing or shortness of breath. 08/19/20   Hall-Potvin, [place], PA-C  aspirin EC 81 MG tablet Take 81 mg by mouth daily. Swallow whole.    [provider]  benzonatate (TESSALON) 100 MG capsule Take 1 capsule (100 mg total) by mouth every 8 (eight)  hours. 08/19/20   Hall-Potvin, [place], PA-C  clonazePAM (KLONOPIN) 0.5 MG tablet Take 0.5 mg by mouth 2 (two) times daily as needed for anxiety.    [provider]  cyclobenzaprine (FLEXERIL) 10 MG tablet Take 1 tablet (10 mg total) by mouth 3 (three) times daily as needed for muscle spasms. 02/06/18   Hermina Staggers, MD  FLUoxetine (PROZAC) 20 MG tablet Take 20 mg by mouth daily.    [provider]  Guaifenesin 1200 MG TB12 Take 1 tablet (1,200 mg total) by mouth in the morning and at bedtime. 10/15/23   Carlisle Beers, FNP  ibuprofen (ADVIL,MOTRIN) 600 MG tablet Take 1 tablet (600 mg total) by mouth every 6 (six) hours. 02/16/18   Lise Auer, MD  Oxycodone HCl 10 MG TABS Take 1 tablet (10 mg total) by mouth 3 (three) times daily as needed. 08/10/23     Prenatal Vit-Fe Fumarate-FA (PRENATAL  MULTIVITAMIN) TABS tablet Take 1 tablet by mouth daily at 12 noon.    [provider]  promethazine-dextromethorphan (PROMETHAZINE-DM) 6.25-15 MG/5ML syrup Take 5 mLs by mouth at bedtime as needed for cough. 10/15/23   Carlisle Beers, FNP    Family History Family History  Problem Relation Age of Onset   Heart disease Mother    Lung cancer Father    Other Neg Hx     Social History Social History   Tobacco Use   Smoking status: Every Day    Types: Cigars   Smokeless tobacco: Never   Tobacco comments:    3 cigars per day  Vaping Use   Vaping status: Never Used  Substance Use Topics   Alcohol use: No   Drug use: Not Currently    Comment: chewable     Allergies   Codeine and Latex   Review of Systems Review of Systems  Constitutional:  Positive for fever.  Respiratory:  Positive for cough.   Gastrointestinal:  Positive for vomiting.  Neurological:  Positive for headaches.     Physical Exam Triage Vital Signs ED Triage Vitals  Encounter Vitals Group     BP 12/05/23 1556 110/76     Systolic BP Percentile --      Diastolic BP Percentile --      Pulse Rate 12/05/23 1556 87     Resp 12/05/23 1556 20     Temp 12/05/23 1556 (!) 102.5 F (39.2 C)     Temp Source 12/05/23 1556 Oral     SpO2 12/05/23 1556 96 %     Weight --      Height --      Head Circumference --      Peak Flow --      Pain Score 12/05/23 1557 8     Pain Loc --      Pain Education --      Exclude from Growth Chart --    No data found.  Updated Vital Signs BP 110/76 (BP Location: Right Arm)   Pulse 87   Temp (!) 102.5 F (39.2 C) (Oral)   Resp 20   SpO2 96%   Visual Acuity Right Eye Distance:   Left Eye Distance:   Bilateral Distance:    Right Eye Near:   Left Eye Near:    Bilateral Near:     Physical Exam Vitals reviewed.  Constitutional:      Appearance: She is well-developed. She is ill-appearing.  HENT:     Head: Normocephalic and atraumatic.  Mouth/Throat:     Mouth: Mucous membranes are moist.  Eyes:     Extraocular Movements: Extraocular movements intact.     Pupils: Pupils are equal, round, and reactive to light.  Cardiovascular:     Rate and Rhythm: Normal rate and regular rhythm.  Pulmonary:     Breath sounds: Wheezing and rhonchi present.  Musculoskeletal:     Cervical back: Normal range of motion and neck supple.  Skin:    General: Skin is dry.  Neurological:     Mental Status: She is alert and oriented to person, place, and time.     GCS: GCS eye subscore is 4. GCS verbal subscore is 5. GCS motor subscore is 6.     Motor: No weakness.     Gait: Gait normal.      UC Treatments / Results  Labs (all labs ordered are listed, but only abnormal results are displayed) Labs Reviewed  POC COVID19/FLU A&B COMBO - Abnormal; Notable for the following components:      Result Value   Influenza A Antigen, POC Positive (*)    All other components within normal limits    EKG   Radiology No results found.  Procedures Procedures (including critical care time)  Medications Ordered in UC Medications  ketorolac (TORADOL) 30 MG/ML injection 30 mg (30 mg Intramuscular Given 12/05/23 1704)    Initial Impression / Assessment and Plan / UC Course  I have reviewed the triage vital signs and the nursing notes.  Pertinent labs & imaging results that were available during my care of the patient were reviewed by me and considered in my medical decision making (see chart for details).    Point-of-care testing confirmed influenza A.  Treating with Tamiflu 75 mg twice daily for 5 days.  Patient encouraged to alternate Tylenol and ibuprofen for fever and headache.  Patient was given Toradol IM here in clinic for acute headache.  Given secondary asthma symptoms prescribed prednisone 40 mg once daily for 5 days.  Patient encouraged to hydrate well with fluids.  ED precautions given symptoms worsen or become severe.  Patient  verbalized understanding and agreement with plan Final Clinical Impressions(s) / UC Diagnoses   Final diagnoses:  Influenza A  Moderate asthma with acute exacerbation, unspecified whether persistent  Generalized headache     Discharge Instructions      Influenza test is positive.  You are considered contagious to others as long as you have a measurable fever with a temperature 100 F.  You should consider yourself infectious until you are fever free for 24 hours without fever lowering medications. Continue to alternate Tylenol and ibuprofen for management of fever.   Force fluids to maintain hydration. Tamiflu twice daily for the next 5 days to reduce symptoms and course of influenza virus.   If you develop any shortness of breath, wheezing or difficulty breathing go immediately to the nearest emergency department.       ED Prescriptions     Medication Sig Dispense Auth. Provider   oseltamivir (TAMIFLU) 75 MG capsule Take 1 capsule (75 mg total) by mouth 2 (two) times daily. 10 capsule Bing Neighbors, NP   predniSONE (DELTASONE) 20 MG tablet Take 2 tablets (40 mg total) by mouth daily with breakfast. 10 tablet Bing Neighbors, NP      PDMP not reviewed this encounter.   Bing Neighbors, NP 12/05/23 [phone]

## 2023-12-05 NOTE — Discharge Instructions (Signed)
Influenza test is positive.  You are considered contagious to others as long as you have a measurable fever with a temperature 100 F.  You should consider yourself infectious until you are fever free for 24 hours without fever lowering medications. Continue to alternate Tylenol and ibuprofen for management of fever.   Force fluids to maintain hydration. Tamiflu twice daily for the next 5 days to reduce symptoms and course of influenza virus.   If you develop any shortness of breath, wheezing or difficulty breathing go immediately to the nearest emergency department.

## 2023-12-05 NOTE — ED Triage Notes (Signed)
Cough, fever, headache, nausea, vomiting onset Sunday.

## 2024-02-12 ENCOUNTER — Other Ambulatory Visit (HOSPITAL_BASED_OUTPATIENT_CLINIC_OR_DEPARTMENT_OTHER): Payer: Self-pay

## 2024-02-12 ENCOUNTER — Ambulatory Visit (HOSPITAL_BASED_OUTPATIENT_CLINIC_OR_DEPARTMENT_OTHER)
Admission: EM | Admit: 2024-02-12 | Discharge: 2024-02-12 | Disposition: A | Discharge status: Home / Self Care | Attending: Family Medicine | Admitting: Family Medicine

## 2024-02-12 ENCOUNTER — Encounter (HOSPITAL_BASED_OUTPATIENT_CLINIC_OR_DEPARTMENT_OTHER): Payer: Self-pay

## 2024-02-12 DIAGNOSIS — J209 Acute bronchitis, unspecified: Secondary | ICD-10-CM

## 2024-02-12 MED ORDER — BENZONATATE 100 MG PO CAPS
100.0000 mg | ORAL_CAPSULE | Freq: Three times a day (TID) | ORAL | 0 refills | Status: AC
  Filled 2024-02-12: qty 21, 7d supply, fill #0

## 2024-02-12 MED ORDER — PREDNISONE 20 MG PO TABS
40.0000 mg | ORAL_TABLET | Freq: Every day | ORAL | 0 refills | Status: AC
  Filled 2024-02-12: qty 10, 5d supply, fill #0

## 2024-02-12 NOTE — ED Provider Notes (Signed)
 [name]    CSN: [id number] Arrival date & time: 02/12/24  1415      History   Chief Complaint Chief Complaint  Patient presents with   Cough   Shortness of Breath    HPI Brecken Walth is a 43 y.o. female.   43 year old female with cough, SOB. Hx of Asthma. Current everyday smoker. 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 Has not been taking allergy medication. Using her inhaler and nebulizer with minimal relief. No fever, chills. Hx of similar symptoms and treated with Bronchitis a few months ago.    Cough Associated symptoms: shortness of breath   Shortness of Breath Associated symptoms: cough     Past Medical History:  Diagnosis Date   Abnormal Pap smear    colpo   ADHD (attention deficit hyperactivity disorder)    Anxiety    Asthma    Degenerative disc disease, lumbar    Depression    PP depression with meds and counselor    Hypertension    stopped taking medicine   Insomnia    Interstitial cystitis    Overactive bladder    Vaginal Pap smear, abnormal     Patient Active Problem List   Diagnosis Date Noted   Normal labor 02/14/2018   Obesity during pregnancy 02/13/2018   Tobacco smoking affecting pregnancy in second trimester 10/31/2017   Insomnia 10/31/2017   Nausea 10/31/2017   Constipation in pregnancy in second trimester 10/31/2017   Supervision of other normal pregnancy, antepartum 08/10/2017   Rh negative state in antepartum period 08/10/2017   Anxiety disorder affecting pregnancy, antepartum 08/10/2017   Depression 08/10/2017   Overactive bladder 08/10/2017   Hypertension in pregnancy, antepartum 08/10/2017   Advanced maternal age in multigravida 08/10/2017   Benign essential hypertension 08/10/2017    Past Surgical History:  Procedure Laterality Date   COLPOSCOPY  2013   WISDOM TOOTH EXTRACTION      OB History     Gravida  5   Para  2   Term  2   Preterm  0   AB  3   Living  2      SAB  3   IAB  0   Ectopic  0    Multiple  0   Live Births  2            Home Medications    Prior to Admission medications   Medication Sig Start Date End Date Taking? Authorizing Provider  benzonatate  (TESSALON ) 100 MG capsule Take 1 capsule (100 mg total) by mouth every 8 (eight) hours. 02/12/24  Yes Saquan Furtick A, FNP  predniSONE  (DELTASONE ) 20 MG tablet Take 2 tablets (40 mg total) by mouth daily with breakfast for 5 days. 02/12/24 02/17/24 Yes Tonny Isensee A, FNP  albuterol  (VENTOLIN  HFA) 108 (90 Base) MCG/ACT inhaler Inhale 2 puffs into the lungs every 6 (six) hours as needed for wheezing or shortness of breath. 08/19/20   Hall-Potvin, [place], PA-C  aspirin  EC 81 MG tablet Take 81 mg by mouth daily. Swallow whole.    [provider]  clonazePAM (KLONOPIN) 0.5 MG tablet Take 0.5 mg by mouth 2 (two) times daily as needed for anxiety.    [provider]  ibuprofen  (ADVIL ,MOTRIN ) 600 MG tablet Take 1 tablet (600 mg total) by mouth every 6 (six) hours. 02/16/18   Campbell, Megan C, MD  Oxycodone  HCl 10 MG TABS Take 1 tablet (10 mg total) by mouth 3 (three) times daily as  needed. 08/10/23     Prenatal Vit-Fe Fumarate-FA (PRENATAL MULTIVITAMIN) TABS tablet Take 1 tablet by mouth daily at 12 noon.    [provider]  promethazine  (PHENERGAN ) 25 MG tablet Take 25 mg by mouth every 6 (six) hours as needed for nausea or vomiting.    [provider]    Family History Family History  Problem Relation Age of Onset   Heart disease Mother    Lung cancer Father    Other Neg Hx     Social History Social History   Tobacco Use   Smoking status: Every Day    Types: Cigars   Smokeless tobacco: Never   Tobacco comments:    3 cigars per day  Vaping Use   Vaping status: Never Used  Substance Use Topics   Alcohol use: No   Drug use: Not Currently    Comment: chewable     Allergies   Codeine and Latex   Review of Systems Review of Systems  Respiratory:  Positive for cough and  shortness of breath.      Physical Exam Triage Vital Signs ED Triage Vitals  Encounter Vitals Group     BP 02/12/24 1428 118/79     Systolic BP Percentile --      Diastolic BP Percentile --      Pulse Rate 02/12/24 1428 86     Resp 02/12/24 1428 20     Temp 02/12/24 1428 98 F (36.7 C)     Temp src --      SpO2 02/12/24 1428 93 %     Weight --      Height --      Head Circumference --      Peak Flow --      Pain Score 02/12/24 1431 0     Pain Loc --      Pain Education --      Exclude from Growth Chart --    No data found.  Updated Vital Signs BP 118/79 (BP Location: Right Arm)   Pulse 86   Temp 98 F (36.7 C)   Resp 20   SpO2 93%   Visual Acuity Right Eye Distance:   Left Eye Distance:   Bilateral Distance:    Right Eye Near:   Left Eye Near:    Bilateral Near:     Physical Exam Constitutional:      General: She is in acute distress.     Appearance: She is well-developed. She is not ill-appearing or toxic-appearing.  Pulmonary:     Breath sounds: Examination of the right-middle field reveals wheezing and rhonchi. Examination of the left-middle field reveals wheezing and rhonchi. Examination of the right-lower field reveals wheezing and rhonchi. Examination of the left-lower field reveals wheezing and rhonchi. Decreased breath sounds, wheezing and rhonchi present.  Neurological:     Mental Status: She is alert.      UC Treatments / Results  Labs (all labs ordered are listed, but only abnormal results are displayed) Labs Reviewed - No data to display  EKG   Radiology No results found.  Procedures Procedures (including critical care time)  Medications Ordered in UC Medications - No data to display  Initial Impression / Assessment and Plan / UC Course  I have reviewed the triage vital signs and the nursing notes.  Pertinent labs & imaging results that were available during my care of the patient were reviewed by me and considered in my medical  decision making (see  chart for details).     Acute bronchitis- treating with prednisone  daily for 5 days.  Tessalon  pearls for cough.  Start daily allergy pill along with Flonase nasal spray daily. Follow-up for any continued issues Final Clinical Impressions(s) / UC Diagnoses   Final diagnoses:  Acute bronchitis, unspecified organism     Discharge Instructions      I am treating you for what is most likely allergy induced asthma flareup.  I recommend doing daily allergy pill along with Flonase. Prescribing prednisone  to take daily for 5 days for inflammation in the lungs.  You can continue using your inhalers as needed. Tessalon  Perles for cough as needed Follow-up for any worsening issues    ED Prescriptions     Medication Sig Dispense Auth. Provider   predniSONE  (DELTASONE ) 20 MG tablet Take 2 tablets (40 mg total) by mouth daily with breakfast for 5 days. 10 tablet Kida Digiulio A, FNP   benzonatate  (TESSALON ) 100 MG capsule Take 1 capsule (100 mg total) by mouth every 8 (eight) hours. 21 capsule Landa Pine, FNP      PDMP not reviewed this encounter.   Landa Pine, FNP 02/12/24 1644

## 2024-02-12 NOTE — Discharge Instructions (Signed)
 I am treating you for what is most likely allergy induced asthma flareup.  I recommend doing daily allergy pill along with Flonase. Prescribing prednisone  to take daily for 5 days for inflammation in the lungs.  You can continue using your inhalers as needed. Tessalon  Perles for cough as needed Follow-up for any worsening issues

## 2024-02-12 NOTE — ED Triage Notes (Signed)
 Started as a "simple cold" last week Friday. Dry cough. Believes she has bronchitis. Has been using her inhalers with little improvement. Hx of asthma. + smoker

## 2024-02-14 ENCOUNTER — Ambulatory Visit (INDEPENDENT_AMBULATORY_CARE_PROVIDER_SITE_OTHER)
Admit: 2024-02-14 | Discharge: 2024-02-14 | Disposition: A | Discharge status: Home / Self Care | Attending: Family Medicine | Admitting: Family Medicine

## 2024-02-14 ENCOUNTER — Encounter (HOSPITAL_BASED_OUTPATIENT_CLINIC_OR_DEPARTMENT_OTHER): Payer: Self-pay | Admitting: Emergency Medicine

## 2024-02-14 ENCOUNTER — Ambulatory Visit (HOSPITAL_BASED_OUTPATIENT_CLINIC_OR_DEPARTMENT_OTHER): Admission: EM | Admit: 2024-02-14 | Discharge: 2024-02-14 | Disposition: A | Discharge status: Home / Self Care

## 2024-02-14 DIAGNOSIS — R059 Cough, unspecified: Secondary | ICD-10-CM

## 2024-02-14 DIAGNOSIS — J4551 Severe persistent asthma with (acute) exacerbation: Secondary | ICD-10-CM

## 2024-02-14 DIAGNOSIS — R051 Acute cough: Secondary | ICD-10-CM

## 2024-02-14 MED ORDER — METHYLPREDNISOLONE ACETATE 40 MG/ML IJ SUSP
40.0000 mg | Freq: Once | INTRAMUSCULAR | Status: AC
  Administered 2024-02-14: 40 mg via INTRAMUSCULAR

## 2024-02-14 NOTE — ED Triage Notes (Signed)
 Pt reports no improvement was seen on 04/21 pt unable to sleep do to the coughing, pt still taking the prednisone .

## 2024-02-14 NOTE — Discharge Instructions (Signed)
 Asthma exacerbation with acute viral bronchitis: Chest x-ray appears negative for pneumonia.  Will update the patient if the radiologist impression differs from my review.  Continue use of albuterol  inhaler and nebulizer as directed and as needed.  Use benzonatate  100 mg capsule 1 every 8 hours if needed for cough.  Continue cetirizine 10 mg daily for allergies.  Added Depo-Medrol , 40 mg muscular injection today.  Complete the prednisone  as previously prescribed.  Get plenty of fluids and rest.  Follow-up if symptoms do not improve, worsen or new symptoms occur.

## 2024-02-14 NOTE — ED Provider Notes (Signed)
 [name]    CSN: [id number] Arrival date & time: 02/14/24  1448      History   Chief Complaint Chief Complaint  Patient presents with   Cough    HPI Letishia Elliott is a 43 y.o. female.   Patient was seen on 02/12/2024 and diagnosed with acute bronchitis with history of asthma and allergies.  Her symptoms had started on 02/09/2024.  She was treated with prednisone  and benzonatate  for the bronchitis.  Unable to sleep last night due to coughing and wheezing.  She has a home nebulizer machine and took a nebulizer treatment an hour prior to coming here to be seen.   Cough Associated symptoms: shortness of breath and wheezing   Associated symptoms: no chest pain, no chills, no ear pain, no fever, no rash and no sore throat     Past Medical History:  Diagnosis Date   Abnormal Pap smear    colpo   ADHD (attention deficit hyperactivity disorder)    Anxiety    Asthma    Degenerative disc disease, lumbar    Depression    PP depression with meds and counselor    Hypertension    stopped taking medicine   Insomnia    Interstitial cystitis    Overactive bladder    Vaginal Pap smear, abnormal     Patient Active Problem List   Diagnosis Date Noted   Normal labor 02/14/2018   Obesity during pregnancy 02/13/2018   Tobacco smoking affecting pregnancy in second trimester 10/31/2017   Insomnia 10/31/2017   Nausea 10/31/2017   Constipation in pregnancy in second trimester 10/31/2017   Supervision of other normal pregnancy, antepartum 08/10/2017   Rh negative state in antepartum period 08/10/2017   Anxiety disorder affecting pregnancy, antepartum 08/10/2017   Depression 08/10/2017   Overactive bladder 08/10/2017   Hypertension in pregnancy, antepartum 08/10/2017   Advanced maternal age in multigravida 08/10/2017   Benign essential hypertension 08/10/2017    Past Surgical History:  Procedure Laterality Date   COLPOSCOPY  2013   WISDOM TOOTH EXTRACTION      OB  History     Gravida  5   Para  2   Term  2   Preterm  0   AB  3   Living  2      SAB  3   IAB  0   Ectopic  0   Multiple  0   Live Births  2            Home Medications    Prior to Admission medications   Medication Sig Start Date End Date Taking? Authorizing Provider  albuterol  (PROVENTIL ) (2.5 MG/3ML) 0.083% nebulizer solution Take 2.5 mg by nebulization every 6 (six) hours as needed for wheezing or shortness of breath.   Yes [provider]  cetirizine (ZYRTEC) 10 MG chewable tablet Chew 10 mg by mouth daily.   Yes [provider]  albuterol  (VENTOLIN  HFA) 108 (90 Base) MCG/ACT inhaler Inhale 2 puffs into the lungs every 6 (six) hours as needed for wheezing or shortness of breath. 08/19/20   Hall-Potvin, [place], PA-C  aspirin  EC 81 MG tablet Take 81 mg by mouth daily. Swallow whole.    [provider]  benzonatate  (TESSALON ) 100 MG capsule Take 1 capsule (100 mg total) by mouth every 8 (eight) hours. 02/12/24   Landa Pine, FNP  clonazePAM (KLONOPIN) 0.5 MG tablet Take 0.5 mg by mouth 2 (two) times daily as needed for  anxiety.    [provider]  ibuprofen  (ADVIL ,MOTRIN ) 600 MG tablet Take 1 tablet (600 mg total) by mouth every 6 (six) hours. 02/16/18   Campbell, Megan C, MD  Oxycodone  HCl 10 MG TABS Take 1 tablet (10 mg total) by mouth 3 (three) times daily as needed. 08/10/23     predniSONE  (DELTASONE ) 20 MG tablet Take 2 tablets (40 mg total) by mouth daily with breakfast for 5 days. 02/12/24 02/17/24  Landa Pine, FNP  Prenatal Vit-Fe Fumarate-FA (PRENATAL MULTIVITAMIN) TABS tablet Take 1 tablet by mouth daily at 12 noon.    [provider]  promethazine  (PHENERGAN ) 25 MG tablet Take 25 mg by mouth every 6 (six) hours as needed for nausea or vomiting.    [provider]    Family History Family History  Problem Relation Age of Onset   Heart disease Mother    Lung cancer Father    Other Neg Hx      Social History Social History   Tobacco Use   Smoking status: Every Day    Types: Cigars   Smokeless tobacco: Never   Tobacco comments:    3 cigars per day  Vaping Use   Vaping status: Never Used  Substance Use Topics   Alcohol use: No   Drug use: Not Currently    Comment: chewable     Allergies   Codeine and Latex   Review of Systems Review of Systems  Constitutional:  Negative for chills and fever.  HENT:  Negative for ear pain and sore throat.   Eyes:  Negative for pain and visual disturbance.  Respiratory:  Positive for cough, shortness of breath and wheezing.   Cardiovascular:  Negative for chest pain and palpitations.  Gastrointestinal:  Negative for abdominal pain, constipation, diarrhea, nausea and vomiting.  Genitourinary:  Negative for dysuria and hematuria.  Musculoskeletal:  Negative for arthralgias and back pain.  Skin:  Negative for color change and rash.  Neurological:  Negative for seizures and syncope.  All other systems reviewed and are negative.    Physical Exam Triage Vital Signs ED Triage Vitals  Encounter Vitals Group     BP 02/14/24 1500 129/75     Systolic BP Percentile --      Diastolic BP Percentile --      Pulse Rate 02/14/24 1500 81     Resp 02/14/24 1500 18     Temp 02/14/24 1500 98.3 F (36.8 C)     Temp Source 02/14/24 1500 Oral     SpO2 02/14/24 1500 94 %     Weight --      Height --      Head Circumference --      Peak Flow --      Pain Score 02/14/24 1459 0     Pain Loc --      Pain Education --      Exclude from Growth Chart --    No data found.  Updated Vital Signs BP 129/75 (BP Location: Right Arm)   Pulse 81   Temp 98.3 F (36.8 C) (Oral)   Resp 18   SpO2 94%   Visual Acuity Right Eye Distance:   Left Eye Distance:   Bilateral Distance:    Right Eye Near:   Left Eye Near:    Bilateral Near:     Physical Exam Vitals and nursing note reviewed.  Constitutional:      General: She is not in acute  distress.  Appearance: She is well-developed. She is not ill-appearing or toxic-appearing.  HENT:     Head: Normocephalic and atraumatic.     Right Ear: Hearing, tympanic membrane, ear canal and external ear normal.     Left Ear: Hearing, tympanic membrane, ear canal and external ear normal.     Nose: Congestion and rhinorrhea present. Rhinorrhea is clear.     Right Sinus: No maxillary sinus tenderness or frontal sinus tenderness.     Left Sinus: No maxillary sinus tenderness or frontal sinus tenderness.     Mouth/Throat:     Lips: Pink.     Mouth: Mucous membranes are moist.     Pharynx: Uvula midline. No oropharyngeal exudate or posterior oropharyngeal erythema.     Tonsils: No tonsillar exudate.  Eyes:     Conjunctiva/sclera: Conjunctivae normal.     Pupils: Pupils are equal, round, and reactive to light.  Cardiovascular:     Rate and Rhythm: Normal rate and regular rhythm.     Heart sounds: S1 normal and S2 normal. No murmur heard. Pulmonary:     Effort: Pulmonary effort is normal. No respiratory distress.     Breath sounds: Examination of the right-upper field reveals wheezing. Examination of the left-upper field reveals wheezing. Examination of the right-middle field reveals wheezing. Examination of the left-middle field reveals wheezing. Examination of the right-lower field reveals decreased breath sounds and wheezing. Examination of the left-lower field reveals decreased breath sounds and wheezing. Decreased breath sounds and wheezing present. No rhonchi or rales.  Abdominal:     General: Bowel sounds are normal.     Palpations: Abdomen is soft.     Tenderness: There is no abdominal tenderness.  Musculoskeletal:        General: No swelling.     Cervical back: Neck supple.  Lymphadenopathy:     Head:     Right side of head: No submental, submandibular, tonsillar, preauricular or posterior auricular adenopathy.     Left side of head: No submental, submandibular, tonsillar,  preauricular or posterior auricular adenopathy.     Cervical: No cervical adenopathy.     Right cervical: No superficial cervical adenopathy.    Left cervical: No superficial cervical adenopathy.  Skin:    General: Skin is warm and dry.     Capillary Refill: Capillary refill takes less than 2 seconds.     Findings: No rash.  Neurological:     Mental Status: She is alert and oriented to person, place, and time.  Psychiatric:        Mood and Affect: Mood normal.      UC Treatments / Results  Labs (all labs ordered are listed, but only abnormal results are displayed) Labs Reviewed - No data to display  EKG   Radiology DG Chest 2 View Result Date: 02/14/2024 CLINICAL DATA:  Cough. EXAM: CHEST - 2 VIEW COMPARISON:  Mar 14, 2017. FINDINGS: The heart size and mediastinal contours are within normal limits. Both lungs are clear. The visualized skeletal structures are unremarkable. IMPRESSION: No active cardiopulmonary disease. Electronically Signed   By: Rosalene Colon M.D.   On: 02/14/2024 16:16    Procedures Procedures (including critical care time)  Medications Ordered in UC Medications  methylPREDNISolone  acetate (DEPO-MEDROL ) injection 40 mg (40 mg Intramuscular Given 02/14/24 1628)    Initial Impression / Assessment and Plan / UC Course  I have reviewed the triage vital signs and the nursing notes.  Pertinent labs & imaging results that were available during  my care of the patient were reviewed by me and considered in my medical decision making (see chart for details).     Plan of Care: Acute asthma exacerbation with secondary viral bronchitis: Depo-Medrol  40 mg IM now.  Continue albuterol  metered-dose inhaler and albuterol  nebulizer as directed and as needed.  Continue benzonatate  as previously prescribed, if needed.  Complete the prednisone .  Advised that chest x-ray appears clear or negative for pneumonia.  Will contact her if radiology review differs.  Get plenty of  fluids and rest.  Follow-up if symptoms do not improve, worsen or new symptoms occur.  Final Clinical Impressions(s) / UC Diagnoses   Final diagnoses:  Acute cough  Severe persistent asthma with acute exacerbation     Discharge Instructions      Asthma exacerbation with acute viral bronchitis: Chest x-ray appears negative for pneumonia.  Will update the patient if the radiologist impression differs from my review.  Continue use of albuterol  inhaler and nebulizer as directed and as needed.  Use benzonatate  100 mg capsule 1 every 8 hours if needed for cough.  Continue cetirizine 10 mg daily for allergies.  Added Depo-Medrol , 40 mg muscular injection today.  Complete the prednisone  as previously prescribed.  Get plenty of fluids and rest.  Follow-up if symptoms do not improve, worsen or new symptoms occur.     ED Prescriptions   None    PDMP not reviewed this encounter.   Guss Legacy, FNP 02/14/24 [phone]

## 2024-03-15 ENCOUNTER — Other Ambulatory Visit (HOSPITAL_BASED_OUTPATIENT_CLINIC_OR_DEPARTMENT_OTHER): Payer: Self-pay

## 2024-03-15 ENCOUNTER — Encounter (HOSPITAL_BASED_OUTPATIENT_CLINIC_OR_DEPARTMENT_OTHER): Payer: Self-pay

## 2024-03-15 ENCOUNTER — Ambulatory Visit (HOSPITAL_BASED_OUTPATIENT_CLINIC_OR_DEPARTMENT_OTHER)
Admission: EM | Admit: 2024-03-15 | Discharge: 2024-03-15 | Disposition: A | Discharge status: Home / Self Care | Attending: Family Medicine | Admitting: Family Medicine

## 2024-03-15 DIAGNOSIS — J4540 Moderate persistent asthma, uncomplicated: Secondary | ICD-10-CM | POA: Diagnosis not present

## 2024-03-15 MED ORDER — NYSTATIN 100000 UNIT/ML MT SUSP
500000.0000 [IU] | Freq: Four times a day (QID) | OROMUCOSAL | 0 refills | Status: AC
Start: 2024-03-15 — End: ?
  Filled 2024-03-15: qty 140, 7d supply, fill #0

## 2024-03-15 MED ORDER — FLUTICASONE-SALMETEROL 250-50 MCG/ACT IN AEPB
1.0000 | INHALATION_SPRAY | Freq: Two times a day (BID) | RESPIRATORY_TRACT | 1 refills | Status: AC
  Filled 2024-03-15: qty 60, 30d supply, fill #0

## 2024-03-15 MED ORDER — MONTELUKAST SODIUM 10 MG PO TABS
10.0000 mg | ORAL_TABLET | Freq: Every day | ORAL | 1 refills | Status: AC
  Filled 2024-03-15: qty 30, 30d supply, fill #0

## 2024-03-15 NOTE — ED Triage Notes (Signed)
 Has been seen at a different Urgent care twice in last 2 weeks for respiratory infection. Currently taking Doxycycline with 2 days of treatment  left. Believes she has thrush from antibiotics and breathing treatments.

## 2024-03-15 NOTE — Discharge Instructions (Addendum)
 I am starting you on a steroid inhaler and a allergy medication to take at bedtime. Hopefully this will help with your symptoms.  As we talked about I recommend following up with a pulmonologist if problem continues with your lungs. I am prescribing nystatin to help with the thrush in your mouth.  Make sure when you use inhalers to always rinse your mouth out afterwards. Follow-up as needed

## 2024-03-16 NOTE — ED Provider Notes (Signed)
 [facility]    CSN: [id number] Arrival date & time: 03/15/24  1541      History   Chief Complaint Chief Complaint  Patient presents with   Cough    HPI Pamela Duncan is a 43 y.o. female.   Patient is a 43 year old female with past medical history of asthma that presents today with continued cough, wheezing, mild shortness of breath.  She has been seen multiple times over the last 2 weeks and treated multiple occasions with antibiotics and steroids.  She is not getting much improvement with this.  She also has been using DuoNebs and albuterol  inhaler.  She takes daily allergy medication.  Concern due to symptoms not getting any better.  No fever, chills, body aches or night sweats.   Cough   Past Medical History:  Diagnosis Date   Abnormal Pap smear    colpo   ADHD (attention deficit hyperactivity disorder)    Anxiety    Asthma    Degenerative disc disease, lumbar    Depression    PP depression with meds and counselor    Hypertension    stopped taking medicine   Insomnia    Interstitial cystitis    Overactive bladder    Vaginal Pap smear, abnormal     Patient Active Problem List   Diagnosis Date Noted   Normal labor 02/14/2018   Obesity during pregnancy 02/13/2018   Tobacco smoking affecting pregnancy in second trimester 10/31/2017   Insomnia 10/31/2017   Nausea 10/31/2017   Constipation in pregnancy in second trimester 10/31/2017   Supervision of other normal pregnancy, antepartum 08/10/2017   Rh negative state in antepartum period 08/10/2017   Anxiety disorder affecting pregnancy, antepartum 08/10/2017   Depression 08/10/2017   Overactive bladder 08/10/2017   Hypertension in pregnancy, antepartum 08/10/2017   Advanced maternal age in multigravida 08/10/2017   Benign essential hypertension 08/10/2017    Past Surgical History:  Procedure Laterality Date   COLPOSCOPY  2013   WISDOM TOOTH EXTRACTION      OB History     Gravida  5   Para   2   Term  2   Preterm  0   AB  3   Living  2      SAB  3   IAB  0   Ectopic  0   Multiple  0   Live Births  2            Home Medications    Prior to Admission medications   Medication Sig Start Date End Date Taking? Authorizing Provider  fluticasone-salmeterol (ADVAIR DISKUS) 250-50 MCG/ACT AEPB Inhale 1 puff into the lungs 2 (two) times daily. 03/15/24  Yes Azyah Flett A, FNP  montelukast (SINGULAIR) 10 MG tablet Take 1 tablet (10 mg total) by mouth at bedtime. 03/15/24  Yes Khoi Hamberger A, FNP  nystatin (MYCOSTATIN) 100000 UNIT/ML suspension Take 5 mLs (500,000 Units total) by mouth 4 (four) times daily. 03/15/24  Yes Nikeya Maxim A, FNP  albuterol  (PROVENTIL ) (2.5 MG/3ML) 0.083% nebulizer solution Take 2.5 mg by nebulization every 6 (six) hours as needed for wheezing or shortness of breath.    [provider]  albuterol  (VENTOLIN  HFA) 108 (90 Base) MCG/ACT inhaler Inhale 2 puffs into the lungs every 6 (six) hours as needed for wheezing or shortness of breath. 08/19/20   Hall-Potvin, [place], PA-C  aspirin  EC 81 MG tablet Take 81 mg by mouth daily. Swallow whole.    [provider]  benzonatate  (TESSALON ) 100 MG capsule Take 1 capsule (100 mg total) by mouth every 8 (eight) hours. 02/12/24   Landa Pine, FNP  cetirizine (ZYRTEC) 10 MG chewable tablet Chew 10 mg by mouth daily.    [provider]  clonazePAM (KLONOPIN) 0.5 MG tablet Take 0.5 mg by mouth 2 (two) times daily as needed for anxiety.    [provider]  ibuprofen  (ADVIL ,MOTRIN ) 600 MG tablet Take 1 tablet (600 mg total) by mouth every 6 (six) hours. 02/16/18   Campbell, Megan C, MD  Oxycodone  HCl 10 MG TABS Take 1 tablet (10 mg total) by mouth 3 (three) times daily as needed. 08/10/23     Prenatal Vit-Fe Fumarate-FA (PRENATAL MULTIVITAMIN) TABS tablet Take 1 tablet by mouth daily at 12 noon.    [provider]  promethazine  (PHENERGAN ) 25 MG tablet Take 25 mg by mouth  every 6 (six) hours as needed for nausea or vomiting.    [provider]    Family History Family History  Problem Relation Age of Onset   Heart disease Mother    Lung cancer Father    Other Neg Hx     Social History Social History   Tobacco Use   Smoking status: Every Day    Types: Cigars   Smokeless tobacco: Never   Tobacco comments:    3 cigars per day  Vaping Use   Vaping status: Never Used  Substance Use Topics   Alcohol use: No   Drug use: Not Currently    Comment: chewable     Allergies   Codeine, Lisinopril, and Latex   Review of Systems Review of Systems  Respiratory:  Positive for cough.    See HPI  Physical Exam Triage Vital Signs ED Triage Vitals  Encounter Vitals Group     BP 03/15/24 1551 125/89     Systolic BP Percentile --      Diastolic BP Percentile --      Pulse Rate 03/15/24 1551 78     Resp 03/15/24 1551 20     Temp 03/15/24 1551 98.3 F (36.8 C)     Temp Source 03/15/24 1551 Oral     SpO2 03/15/24 1551 96 %     Weight --      Height --      Head Circumference --      Peak Flow --      Pain Score 03/15/24 1552 5     Pain Loc --      Pain Education --      Exclude from Growth Chart --    No data found.  Updated Vital Signs BP 125/89   Pulse 78   Temp 98.3 F (36.8 C) (Oral)   Resp 20   SpO2 96%   Visual Acuity Right Eye Distance:   Left Eye Distance:   Bilateral Distance:    Right Eye Near:   Left Eye Near:    Bilateral Near:     Physical Exam Vitals and nursing note reviewed.  Constitutional:      General: She is not in acute distress.    Appearance: Normal appearance. She is not ill-appearing, toxic-appearing or diaphoretic.  HENT:     Head: Normocephalic and atraumatic.     Right Ear: Tympanic membrane and ear canal normal.     Left Ear: Tympanic membrane and ear canal normal.     Mouth/Throat:     Mouth: Mucous membranes are moist.     Comments:  Very erythematous tongue with small white patch.   Eyes:     Conjunctiva/sclera: Conjunctivae normal.  Cardiovascular:     Rate and Rhythm: Normal rate and regular rhythm.     Pulses: Normal pulses.     Heart sounds: Normal heart sounds.  Pulmonary:     Effort: Pulmonary effort is normal.     Breath sounds: Examination of the right-upper field reveals decreased breath sounds and wheezing. Examination of the left-upper field reveals decreased breath sounds and wheezing. Examination of the right-middle field reveals decreased breath sounds and wheezing. Examination of the left-middle field reveals decreased breath sounds and wheezing. Examination of the right-lower field reveals decreased breath sounds and wheezing. Examination of the left-lower field reveals decreased breath sounds and wheezing. Decreased breath sounds and wheezing present.  Skin:    General: Skin is warm and dry.  Neurological:     Mental Status: She is alert.  Psychiatric:        Mood and Affect: Mood normal.      UC Treatments / Results  Labs (all labs ordered are listed, but only abnormal results are displayed) Labs Reviewed - No data to display  EKG   Radiology No results found.  Procedures Procedures (including critical care time)  Medications Ordered in UC Medications - No data to display  Initial Impression / Assessment and Plan / UC Course  I have reviewed the triage vital signs and the nursing notes.  Pertinent labs & imaging results that were available during my care of the patient were reviewed by me and considered in my medical decision making (see chart for details).     Moderate persistent asthma-do not feel that she needs any more antibiotics or prednisone  at this time.  She has done this multiple times with no change.  Decided to go ahead and start her on montelukast to see if this may help with her allergies and asthma.  Also starting on Advair daily. She has developed thrush from all of the medication she has been on.  Starting her on  nystatin suspension for this. Recommend to follow-up with PCP for referral to pulmonologist for continued moderate persistent asthma symptoms Final Clinical Impressions(s) / UC Diagnoses   Final diagnoses:  Moderate persistent asthma, unspecified whether complicated   Discharge Instructions      I am starting you on a steroid inhaler and a allergy medication to take at bedtime. Hopefully this will help with your symptoms.  As we talked about I recommend following up with a pulmonologist if problem continues with your lungs. I am prescribing nystatin to help with the thrush in your mouth.  Make sure when you use inhalers to always rinse your mouth out afterwards. Follow-up as needed  ED Prescriptions     Medication Sig Dispense Auth. Provider   montelukast (SINGULAIR) 10 MG tablet Take 1 tablet (10 mg total) by mouth at bedtime. 30 tablet Acadia Thammavong A, FNP   fluticasone-salmeterol (ADVAIR DISKUS) 250-50 MCG/ACT AEPB Inhale 1 puff into the lungs 2 (two) times daily. 60 each Kadarious Dikes A, FNP   nystatin (MYCOSTATIN) 100000 UNIT/ML suspension Take 5 mLs (500,000 Units total) by mouth 4 (four) times daily. 140 mL Jerri Morale A, FNP      PDMP not reviewed this encounter.   Landa Pine, FNP 03/16/24 [phone]

## 2024-06-02 ENCOUNTER — Ambulatory Visit (HOSPITAL_BASED_OUTPATIENT_CLINIC_OR_DEPARTMENT_OTHER)
Admission: EM | Admit: 2024-06-02 | Discharge: 2024-06-02 | Disposition: A | Discharge status: Home / Self Care | Attending: Family Medicine | Admitting: Family Medicine

## 2024-06-02 ENCOUNTER — Encounter (HOSPITAL_BASED_OUTPATIENT_CLINIC_OR_DEPARTMENT_OTHER): Payer: Self-pay

## 2024-06-02 DIAGNOSIS — N644 Mastodynia: Secondary | ICD-10-CM

## 2024-06-02 DIAGNOSIS — N61 Mastitis without abscess: Secondary | ICD-10-CM

## 2024-06-02 MED ORDER — FLUCONAZOLE 150 MG PO TABS: ORAL_TABLET | ORAL | 0 refills | Status: AC

## 2024-06-02 MED ORDER — AMOXICILLIN-POT CLAVULANATE 875-125 MG PO TABS: 1.0000 | ORAL_TABLET | Freq: Two times a day (BID) | ORAL | 0 refills | Status: AC

## 2024-06-02 NOTE — Discharge Instructions (Addendum)
 Mastitis of right breast with breast pain: Use cold or warm compresses.  Augmentin  875/125 mg, 1 pill twice daily for 7 to 10 days.  Pain is completely resolved at 5 days complete a full 7 days of medication.  If any pain or tenderness 7 days complete the full 10 days of medication.  Fluconazole  150 mg 1 now and repeat in 5 to 7 days as needed for yeast infection secondary to antibiotic use.  May use up to 4 pills over 3 weeks.  Follow-up if symptoms do not improve, worsen or new symptoms occur.

## 2024-06-02 NOTE — ED Provider Notes (Signed)
 [facility]    CSN: [id number] Arrival date & time: 06/02/24  1328      History   Chief Complaint Chief Complaint  Patient presents with   Breast Problem    HPI Pamela [facility] is a 43 y.o. female.   43 year old female with acute onset right nipple pain and tenderness.  It started on approximately 05/26/2024.  She does not breast-feed.  She has not had a baby recently.  She has an IUD in place and has very infrequent menstrual cycles.  This breast is swollen, hard at the nipple, extremely tender.  The right nipple is almost twice the size of the left nipple and the area is red and warm.  Ice packs and warm compresses have not helped.  She had a mammogram on 05/16/2024 and it was normal but she wonders if they compressed her breast too much and cause some kind of injury that caused the infection.     Past Medical History:  Diagnosis Date   Abnormal Pap smear    colpo   ADHD (attention deficit hyperactivity disorder)    Anxiety    Asthma    Degenerative disc disease, lumbar    Depression    PP depression with meds and counselor    Hypertension    stopped taking medicine   Insomnia    Interstitial cystitis    Overactive bladder    Vaginal Pap smear, abnormal     Patient Active Problem List   Diagnosis Date Noted   Normal labor 02/14/2018   Obesity during pregnancy 02/13/2018   Tobacco smoking affecting pregnancy in second trimester 10/31/2017   Insomnia 10/31/2017   Nausea 10/31/2017   Constipation in pregnancy in second trimester 10/31/2017   Supervision of other normal pregnancy, antepartum 08/10/2017   Rh negative state in antepartum period 08/10/2017   Anxiety disorder affecting pregnancy, antepartum 08/10/2017   Depression 08/10/2017   Overactive bladder 08/10/2017   Hypertension in pregnancy, antepartum 08/10/2017   Advanced maternal age in multigravida 08/10/2017   Benign essential hypertension 08/10/2017    Past Surgical History:  Procedure  Laterality Date   COLPOSCOPY  2013   WISDOM TOOTH EXTRACTION      OB History     Gravida  5   Para  2   Term  2   Preterm  0   AB  3   Living  2      SAB  3   IAB  0   Ectopic  0   Multiple  0   Live Births  2            Home Medications    Prior to Admission medications   Medication Sig Start Date End Date Taking? Authorizing Provider  amoxicillin -clavulanate (AUGMENTIN ) 875-125 MG tablet Take 1 tablet by mouth 2 (two) times daily after a meal for 10 days. 06/02/24 06/12/24 Yes Ival Domino, FNP  fluconazole  (DIFLUCAN ) 150 MG tablet By mouth today and repeat every 5 days for vaginal yeast infection.  May use up to four pills, if needed 06/02/24  Yes Ival Domino, FNP  albuterol  (PROVENTIL ) (2.5 MG/3ML) 0.083% nebulizer solution Take 2.5 mg by nebulization every 6 (six) hours as needed for wheezing or shortness of breath.    [provider]  albuterol  (VENTOLIN  HFA) 108 (90 Base) MCG/ACT inhaler Inhale 2 puffs into the lungs every 6 (six) hours as needed for wheezing or shortness of breath. 08/19/20   Hall-Potvin, [place], PA-C  aspirin  EC 81  MG tablet Take 81 mg by mouth daily. Swallow whole.    [provider]  benzonatate  (TESSALON ) 100 MG capsule Take 1 capsule (100 mg total) by mouth every 8 (eight) hours. 02/12/24   Adah Wilbert LABOR, FNP  cetirizine (ZYRTEC) 10 MG chewable tablet Chew 10 mg by mouth daily.    [provider]  clonazePAM (KLONOPIN) 0.5 MG tablet Take 0.5 mg by mouth 2 (two) times daily as needed for anxiety.    [provider]  fluticasone -salmeterol (ADVAIR  DISKUS) 250-50 MCG/ACT AEPB Inhale 1 puff into the lungs 2 (two) times daily. 03/15/24   Adah Wilbert A, FNP  ibuprofen  (ADVIL ,MOTRIN ) 600 MG tablet Take 1 tablet (600 mg total) by mouth every 6 (six) hours. 02/16/18   Elaine Duwaine BROCKS, MD  montelukast  (SINGULAIR ) 10 MG tablet Take 1 tablet (10 mg total) by mouth at bedtime. 03/15/24   Adah Wilbert A, FNP   nystatin  (MYCOSTATIN ) 100000 UNIT/ML suspension Take 5 mLs (500,000 Units total) by mouth 4 (four) times daily. 03/15/24   Adah Wilbert A, FNP  Oxycodone  HCl 10 MG TABS Take 1 tablet (10 mg total) by mouth 3 (three) times daily as needed. 08/10/23     Prenatal Vit-Fe Fumarate-FA (PRENATAL MULTIVITAMIN) TABS tablet Take 1 tablet by mouth daily at 12 noon.    [provider]  promethazine  (PHENERGAN ) 25 MG tablet Take 25 mg by mouth every 6 (six) hours as needed for nausea or vomiting.    [provider]    Family History Family History  Problem Relation Age of Onset   Heart disease Mother    Lung cancer Father    Other Neg Hx     Social History Social History   Tobacco Use   Smoking status: Every Day    Types: Cigars   Smokeless tobacco: Never   Tobacco comments:    3 cigars per day  Vaping Use   Vaping status: Never Used  Substance Use Topics   Alcohol use: No   Drug use: Not Currently    Comment: chewable     Allergies   Codeine, Lisinopril, and Latex   Review of Systems Review of Systems  Constitutional:  Negative for fever.  Respiratory:  Negative for cough.   Cardiovascular:  Negative for chest pain.  Gastrointestinal:  Negative for abdominal pain, constipation, diarrhea, nausea and vomiting.  Genitourinary:        Right breast pain with swelling and tenderness of right nipple  Musculoskeletal:  Negative for arthralgias and back pain.  Skin:  Negative for color change and rash.  Neurological:  Negative for syncope.  All other systems reviewed and are negative.    Physical Exam Triage Vital Signs ED Triage Vitals  Encounter Vitals Group     BP 06/02/24 1340 (!) 142/90     Girls Systolic BP Percentile --      Girls Diastolic BP Percentile --      Boys Systolic BP Percentile --      Boys Diastolic BP Percentile --      Pulse Rate 06/02/24 1340 79     Resp 06/02/24 1340 16     Temp 06/02/24 1340 98.2 F (36.8 C)     Temp Source  06/02/24 1340 Oral     SpO2 06/02/24 1340 93 %     Weight --      Height --      Head Circumference --      Peak Flow --  Pain Score 06/02/24 1338 9     Pain Loc --      Pain Education --      Exclude from Growth Chart --    No data found.  Updated Vital Signs BP (!) 142/90 (BP Location: Right Arm)   Pulse 79   Temp 98.2 F (36.8 C) (Oral)   Resp 16   SpO2 93%   Breastfeeding No   Visual Acuity Right Eye Distance:   Left Eye Distance:   Bilateral Distance:    Right Eye Near:   Left Eye Near:    Bilateral Near:     Physical Exam Vitals and nursing note reviewed.  Constitutional:      General: She is not in acute distress.    Appearance: She is well-developed. She is not ill-appearing or toxic-appearing.  HENT:     Head: Normocephalic and atraumatic.     Right Ear: Hearing, tympanic membrane, ear canal and external ear normal.     Left Ear: Hearing, tympanic membrane, ear canal and external ear normal.     Nose: No congestion or rhinorrhea.     Right Sinus: No maxillary sinus tenderness or frontal sinus tenderness.     Left Sinus: No maxillary sinus tenderness or frontal sinus tenderness.     Mouth/Throat:     Lips: Pink.     Mouth: Mucous membranes are moist.     Pharynx: Uvula midline. No oropharyngeal exudate or posterior oropharyngeal erythema.     Tonsils: No tonsillar exudate.  Eyes:     Conjunctiva/sclera: Conjunctivae normal.     Pupils: Pupils are equal, round, and reactive to light.  Cardiovascular:     Rate and Rhythm: Normal rate and regular rhythm.     Heart sounds: S1 normal and S2 normal. No murmur heard. Pulmonary:     Effort: Pulmonary effort is normal. No respiratory distress.     Breath sounds: Normal breath sounds. No decreased breath sounds, wheezing, rhonchi or rales.  Chest:  Breasts:    Right: Swelling (Swelling of right), skin change (Erythema right) and tenderness present. No bleeding, inverted nipple, mass or nipple discharge.      Left: Normal.    Abdominal:     General: Bowel sounds are normal.     Palpations: Abdomen is soft.     Tenderness: There is no abdominal tenderness.  Musculoskeletal:        General: No swelling.     Cervical back: Neck supple.  Lymphadenopathy:     Head:     Right side of head: No submental, submandibular, tonsillar, preauricular or posterior auricular adenopathy.     Left side of head: No submental, submandibular, tonsillar, preauricular or posterior auricular adenopathy.     Cervical: No cervical adenopathy.     Right cervical: No superficial cervical adenopathy.    Left cervical: No superficial cervical adenopathy.  Skin:    General: Skin is warm and dry.     Capillary Refill: Capillary refill takes less than 2 seconds.     Findings: No rash.  Neurological:     Mental Status: She is alert and oriented to person, place, and time.  Psychiatric:        Mood and Affect: Mood normal.         UC Treatments / Results  Labs (all labs ordered are listed, but only abnormal results are displayed) Labs Reviewed - No data to display  EKG   Radiology No results found.  Procedures Procedures (including  critical care time)  Medications Ordered in UC Medications - No data to display  Initial Impression / Assessment and Plan / UC Course  I have reviewed the triage vital signs and the nursing notes.  Pertinent labs & imaging results that were available during my care of the patient were reviewed by me and considered in my medical decision making (see chart for details).  Plan of Care: Mastitis and pain of right nipple/breast: Augmentin  875/125 mg, 1 pill twice daily for 7 days.  Fluconazole  150 mg 1 pill now and repeat every 5 to 7 days for a maximum total of 4 pills over 3 weeks.  Use warm compresses or cold compresses for comfort.  Use acetaminophen  or ibuprofen  as needed for pain.  Follow-up if symptoms do not improve, worsen or new symptoms occur.  I reviewed the  plan of care with the patient and/or the patient's guardian.  The patient and/or guardian had time to ask questions and acknowledged that the questions were answered.  I provided instruction on symptoms or reasons to return here or to go to an ER, if symptoms/condition did not improve, worsened or if new symptoms occurred.  Final Clinical Impressions(s) / UC Diagnoses   Final diagnoses:  Breast pain, right  Mastitis of right breast unrelated to pregnancy of breastfeeding     Discharge Instructions      Mastitis of right breast with breast pain: Use cold or warm compresses.  Augmentin  875/125 mg, 1 pill twice daily for 7 to 10 days.  Pain is completely resolved at 5 days complete a full 7 days of medication.  If any pain or tenderness 7 days complete the full 10 days of medication.  Fluconazole  150 mg 1 now and repeat in 5 to 7 days as needed for yeast infection secondary to antibiotic use.  May use up to 4 pills over 3 weeks.  Follow-up if symptoms do not improve, worsen or new symptoms occur.     ED Prescriptions     Medication Sig Dispense Auth. Provider   amoxicillin -clavulanate (AUGMENTIN ) 875-125 MG tablet Take 1 tablet by mouth 2 (two) times daily after a meal for 10 days. 20 tablet Avalee Castrellon, FNP   fluconazole  (DIFLUCAN ) 150 MG tablet By mouth today and repeat every 5 days for vaginal yeast infection.  May use up to four pills, if needed 4 tablet Ival Domino, FNP      PDMP not reviewed this encounter.   Ival Domino, FNP 06/02/24 [phone]

## 2024-06-02 NOTE — ED Triage Notes (Addendum)
 Right nipple swollen Tender Started a week ago, redness,  The nipple is hot to touch Not breastfeeding, IUD in place  No discharge Burning pain  Tried ice packs, warm water makes it worse  Had a mammogram completed on 7/24 and came out normal

## 2024-07-29 ENCOUNTER — Ambulatory Visit (HOSPITAL_BASED_OUTPATIENT_CLINIC_OR_DEPARTMENT_OTHER): Payer: Self-pay

## 2024-08-02 ENCOUNTER — Ambulatory Visit (INDEPENDENT_AMBULATORY_CARE_PROVIDER_SITE_OTHER): Admit: 2024-08-02 | Discharge: 2024-08-02 | Disposition: A | Discharge status: Home / Self Care | Admitting: Radiology

## 2024-08-02 ENCOUNTER — Encounter (HOSPITAL_BASED_OUTPATIENT_CLINIC_OR_DEPARTMENT_OTHER): Payer: Self-pay

## 2024-08-02 ENCOUNTER — Ambulatory Visit (HOSPITAL_BASED_OUTPATIENT_CLINIC_OR_DEPARTMENT_OTHER)
Admission: EM | Admit: 2024-08-02 | Discharge: 2024-08-02 | Disposition: A | Discharge status: Home / Self Care | Attending: Family Medicine | Admitting: Family Medicine

## 2024-08-02 DIAGNOSIS — M546 Pain in thoracic spine: Secondary | ICD-10-CM | POA: Diagnosis not present

## 2024-08-02 DIAGNOSIS — R059 Cough, unspecified: Secondary | ICD-10-CM

## 2024-08-02 DIAGNOSIS — R0602 Shortness of breath: Secondary | ICD-10-CM

## 2024-08-02 NOTE — ED Triage Notes (Addendum)
 Patient presents for evaluation of cough,  respiratory symptoms x 2 weeks. States symptoms had improved but over last 2 days, has needed to use nebulizer more often. Patient thinks she has walking pneumonia and in need of an antibiotic/steroid shot. States pain across upper back. Seen at [facility] and prescribed prednisone  and doxycycline for symptoms related to a covid vaccination. Has 1 more dose of prednisone  left.

## 2024-08-03 NOTE — ED Provider Notes (Signed)
 [facility]    CSN: [id number] Arrival date & time: 08/02/24  1626      History   Chief Complaint Chief Complaint  Patient presents with   Cough   Shortness of Breath    HPI Pamela Duncan is a 43 y.o. female.   Patient is a 43 year old female who presents today for  evaluation of cough,  respiratory symptoms x 2 weeks. States symptoms had improved but over last 2 days, has needed to use nebulizer more often. Patient thinks she has walking pneumonia and in need of an antibiotic/steroid shot. States pain across upper back. Seen at [facility] and prescribed prednisone  and doxycycline for symptoms related to a covid vaccination. Has 1 more dose of prednisone  left.    Cough Associated symptoms: shortness of breath   Shortness of Breath Associated symptoms: cough     Past Medical History:  Diagnosis Date   Abnormal Pap smear    colpo   ADHD (attention deficit hyperactivity disorder)    Anxiety    Asthma    Degenerative disc disease, lumbar    Depression    PP depression with meds and counselor    Hypertension    stopped taking medicine   Insomnia    Interstitial cystitis    Overactive bladder    Vaginal Pap smear, abnormal     Patient Active Problem List   Diagnosis Date Noted   Normal labor 02/14/2018   Obesity during pregnancy 02/13/2018   Tobacco smoking affecting pregnancy in second trimester 10/31/2017   Insomnia 10/31/2017   Nausea 10/31/2017   Constipation in pregnancy in second trimester 10/31/2017   Supervision of other normal pregnancy, antepartum 08/10/2017   Rh negative state in antepartum period 08/10/2017   Anxiety disorder affecting pregnancy, antepartum 08/10/2017   Depression 08/10/2017   Overactive bladder 08/10/2017   Hypertension in pregnancy, antepartum 08/10/2017   Advanced maternal age in multigravida 08/10/2017   Benign essential hypertension 08/10/2017    Past Surgical History:  Procedure Laterality Date   COLPOSCOPY   2013   WISDOM TOOTH EXTRACTION      OB History     Gravida  5   Para  2   Term  2   Preterm  0   AB  3   Living  2      SAB  3   IAB  0   Ectopic  0   Multiple  0   Live Births  2            Home Medications    Prior to Admission medications   Medication Sig Start Date End Date Taking? Authorizing Provider  albuterol  (PROVENTIL ) (2.5 MG/3ML) 0.083% nebulizer solution Take 2.5 mg by nebulization every 6 (six) hours as needed for wheezing or shortness of breath.    [provider]  albuterol  (VENTOLIN  HFA) 108 (90 Base) MCG/ACT inhaler Inhale 2 puffs into the lungs every 6 (six) hours as needed for wheezing or shortness of breath. 08/19/20   Hall-Potvin, [place], PA-C  aspirin  EC 81 MG tablet Take 81 mg by mouth daily. Swallow whole.    [provider]  benzonatate  (TESSALON ) 100 MG capsule Take 1 capsule (100 mg total) by mouth every 8 (eight) hours. 02/12/24   Adah Wilbert LABOR, FNP  cetirizine (ZYRTEC) 10 MG chewable tablet Chew 10 mg by mouth daily.    [provider]  clonazePAM (KLONOPIN) 0.5 MG tablet Take 0.5 mg by mouth 2 (two) times daily as  needed for anxiety.    [provider]  fluconazole  (DIFLUCAN ) 150 MG tablet By mouth today and repeat every 5 days for vaginal yeast infection.  May use up to four pills, if needed 06/02/24   Ival Domino, FNP  fluticasone -salmeterol (ADVAIR  DISKUS) 250-50 MCG/ACT AEPB Inhale 1 puff into the lungs 2 (two) times daily. 03/15/24   Adah Corning A, FNP  ibuprofen  (ADVIL ,MOTRIN ) 600 MG tablet Take 1 tablet (600 mg total) by mouth every 6 (six) hours. 02/16/18   Elaine Duwaine BROCKS, MD  montelukast  (SINGULAIR ) 10 MG tablet Take 1 tablet (10 mg total) by mouth at bedtime. 03/15/24   Adah Corning LABOR, FNP  nystatin  (MYCOSTATIN ) 100000 UNIT/ML suspension Take 5 mLs (500,000 Units total) by mouth 4 (four) times daily. 03/15/24   Adah Corning A, FNP  Oxycodone  HCl 10 MG TABS Take 1 tablet (10 mg total) by  mouth 3 (three) times daily as needed. 08/10/23     Prenatal Vit-Fe Fumarate-FA (PRENATAL MULTIVITAMIN) TABS tablet Take 1 tablet by mouth daily at 12 noon.    [provider]  promethazine  (PHENERGAN ) 25 MG tablet Take 25 mg by mouth every 6 (six) hours as needed for nausea or vomiting.    [provider]    Family History Family History  Problem Relation Age of Onset   Heart disease Mother    Lung cancer Father    Other Neg Hx     Social History Social History   Tobacco Use   Smoking status: Every Day    Types: Cigars   Smokeless tobacco: Never   Tobacco comments:    3 cigars per day  Vaping Use   Vaping status: Never Used  Substance Use Topics   Alcohol use: No   Drug use: Not Currently    Comment: chewable     Allergies   Codeine, Lisinopril, and Latex   Review of Systems Review of Systems  Respiratory:  Positive for cough and shortness of breath.      Physical Exam Triage Vital Signs ED Triage Vitals  Encounter Vitals Group     BP 08/02/24 1651 137/84     Girls Systolic BP Percentile --      Girls Diastolic BP Percentile --      Boys Systolic BP Percentile --      Boys Diastolic BP Percentile --      Pulse Rate 08/02/24 1651 64     Resp 08/02/24 1651 20     Temp 08/02/24 1651 97.8 F (36.6 C)     Temp Source 08/02/24 1651 Oral     SpO2 08/02/24 1651 95 %     Weight --      Height --      Head Circumference --      Peak Flow --      Pain Score 08/02/24 1653 4     Pain Loc --      Pain Education --      Exclude from Growth Chart --    No data found.  Updated Vital Signs BP 137/84 (BP Location: Right Arm)   Pulse 64   Temp 97.8 F (36.6 C) (Oral)   Resp 20   SpO2 95%   Visual Acuity Right Eye Distance:   Left Eye Distance:   Bilateral Distance:    Right Eye Near:   Left Eye Near:    Bilateral Near:     Physical Exam Vitals and nursing note reviewed.  Constitutional:  General: She is not in acute distress.     Appearance: Normal appearance. She is not ill-appearing, toxic-appearing or diaphoretic.  Pulmonary:     Effort: Pulmonary effort is normal. No tachypnea or respiratory distress.     Breath sounds: Normal breath sounds.  Neurological:     Mental Status: She is alert.  Psychiatric:        Mood and Affect: Mood normal.      UC Treatments / Results  Labs (all labs ordered are listed, but only abnormal results are displayed) Labs Reviewed - No data to display  EKG   Radiology DG Chest 2 View Result Date: 08/02/2024 CLINICAL DATA:  Cough and shortness of breath. EXAM: CHEST - 2 VIEW COMPARISON:  February 14, 2024 FINDINGS: The heart size and mediastinal contours are within normal limits. Both lungs are clear. The visualized skeletal structures are unremarkable. IMPRESSION: No active cardiopulmonary disease. Electronically Signed   By: Suzen Dials M.D.   On: 08/02/2024 18:11    Procedures Procedures (including critical care time)  Medications Ordered in UC Medications - No data to display  Initial Impression / Assessment and Plan / UC Course  I have reviewed the triage vital signs and the nursing notes.  Pertinent labs & imaging results that were available during my care of the patient were reviewed by me and considered in my medical decision making (see chart for details).     Acute back pain and mild shortness of breath.  There is no concerns on her x-ray today for underlying infection.  Lung sounds are clear on exam today.  I believe that she she is having some musculoskeletal pain, inflammation from recent virus and COVID-vaccine.  Recommend taking some ibuprofen  to help with pain and inflammation. Continue over-the-counter medications as needed for other symptoms.  Follow-up as needed Final Clinical Impressions(s) / UC Diagnoses   Final diagnoses:  SOB (shortness of breath)  Acute bilateral thoracic back pain     Discharge Instructions      Your x-ray was  normal.  There is no concerns here today.  Continue over-the-counter medications as needed and follow-up with your doctor as needed     ED Prescriptions   None    PDMP not reviewed this encounter.   Adah Wilbert LABOR, FNP 08/03/24 [phone]

## 2024-08-03 NOTE — Discharge Instructions (Signed)
 Your x-ray was normal.  There is no concerns here today.  Continue over-the-counter medications as needed and follow-up with your doctor as needed

## 2024-11-07 ENCOUNTER — Ambulatory Visit: Admitting: Podiatry

## 2024-11-14 ENCOUNTER — Ambulatory Visit: Admitting: Podiatry

## 2024-11-15 ENCOUNTER — Ambulatory Visit: Admitting: Podiatry

## 2024-12-06 ENCOUNTER — Ambulatory Visit: Admitting: Podiatry
